# Patient Record
Sex: Female | Born: 1960 | Race: White | Hispanic: No | Marital: Married | Smoking: Current every day smoker
Health system: Southern US, Community
[De-identification: ages and names within clinical notes are randomized; demographics above are authoritative.]

## PROBLEM LIST (undated history)

## (undated) ENCOUNTER — Emergency Department (HOSPITAL_COMMUNITY): Admission: EM | Payer: Managed Care, Other (non HMO) | Source: Home / Self Care

## (undated) DIAGNOSIS — N289 Disorder of kidney and ureter, unspecified: Secondary | ICD-10-CM

## (undated) DIAGNOSIS — T7840XA Allergy, unspecified, initial encounter: Secondary | ICD-10-CM

## (undated) DIAGNOSIS — R569 Unspecified convulsions: Secondary | ICD-10-CM

## (undated) DIAGNOSIS — G709 Myoneural disorder, unspecified: Secondary | ICD-10-CM

## (undated) DIAGNOSIS — I10 Essential (primary) hypertension: Secondary | ICD-10-CM

## (undated) DIAGNOSIS — E079 Disorder of thyroid, unspecified: Secondary | ICD-10-CM

## (undated) DIAGNOSIS — K529 Noninfective gastroenteritis and colitis, unspecified: Secondary | ICD-10-CM

## (undated) DIAGNOSIS — K209 Esophagitis, unspecified without bleeding: Secondary | ICD-10-CM

## (undated) DIAGNOSIS — Z86010 Personal history of colonic polyps: Secondary | ICD-10-CM

## (undated) DIAGNOSIS — K8020 Calculus of gallbladder without cholecystitis without obstruction: Secondary | ICD-10-CM

## (undated) DIAGNOSIS — R002 Palpitations: Secondary | ICD-10-CM

## (undated) DIAGNOSIS — M1990 Unspecified osteoarthritis, unspecified site: Secondary | ICD-10-CM

## (undated) DIAGNOSIS — Z860101 Personal history of adenomatous and serrated colon polyps: Secondary | ICD-10-CM

## (undated) DIAGNOSIS — K469 Unspecified abdominal hernia without obstruction or gangrene: Secondary | ICD-10-CM

## (undated) DIAGNOSIS — F419 Anxiety disorder, unspecified: Secondary | ICD-10-CM

## (undated) DIAGNOSIS — M797 Fibromyalgia: Secondary | ICD-10-CM

## (undated) DIAGNOSIS — J40 Bronchitis, not specified as acute or chronic: Secondary | ICD-10-CM

## (undated) DIAGNOSIS — K317 Polyp of stomach and duodenum: Secondary | ICD-10-CM

## (undated) DIAGNOSIS — R0789 Other chest pain: Secondary | ICD-10-CM

## (undated) DIAGNOSIS — K219 Gastro-esophageal reflux disease without esophagitis: Secondary | ICD-10-CM

## (undated) HISTORY — DX: Noninfective gastroenteritis and colitis, unspecified: K52.9

## (undated) HISTORY — PX: CHOLECYSTECTOMY: SHX55

## (undated) HISTORY — DX: Other chest pain: R07.89

## (undated) HISTORY — DX: Essential (primary) hypertension: I10

## (undated) HISTORY — DX: Unspecified abdominal hernia without obstruction or gangrene: K46.9

## (undated) HISTORY — DX: Myoneural disorder, unspecified: G70.9

## (undated) HISTORY — PX: POLYPECTOMY: SHX149

## (undated) HISTORY — DX: Gastro-esophageal reflux disease without esophagitis: K21.9

## (undated) HISTORY — DX: Disorder of thyroid, unspecified: E07.9

## (undated) HISTORY — PX: TUBAL LIGATION: SHX77

## (undated) HISTORY — DX: Bronchitis, not specified as acute or chronic: J40

## (undated) HISTORY — DX: Personal history of adenomatous and serrated colon polyps: Z86.0101

## (undated) HISTORY — DX: Esophagitis, unspecified without bleeding: K20.90

## (undated) HISTORY — PX: UPPER GASTROINTESTINAL ENDOSCOPY: SHX188

## (undated) HISTORY — DX: Calculus of gallbladder without cholecystitis without obstruction: K80.20

## (undated) HISTORY — DX: Esophagitis, unspecified: K20.9

## (undated) HISTORY — PX: COLONOSCOPY: SHX174

## (undated) HISTORY — DX: Personal history of colonic polyps: Z86.010

## (undated) HISTORY — DX: Palpitations: R00.2

## (undated) HISTORY — DX: Anxiety disorder, unspecified: F41.9

## (undated) HISTORY — DX: Allergy, unspecified, initial encounter: T78.40XA

## (undated) HISTORY — DX: Polyp of stomach and duodenum: K31.7

---

## 2007-04-08 ENCOUNTER — Encounter: Admission: RE | Admit: 2007-04-08 | Discharge: 2007-04-08 | Payer: Self-pay | Admitting: Surgery

## 2010-08-24 ENCOUNTER — Emergency Department (HOSPITAL_COMMUNITY)
Admission: EM | Admit: 2010-08-24 | Discharge: 2010-08-24 | Disposition: A | Discharge status: Home / Self Care | Payer: Worker's Compensation | Attending: Emergency Medicine | Admitting: Emergency Medicine

## 2010-08-24 ENCOUNTER — Emergency Department (HOSPITAL_COMMUNITY): Payer: Worker's Compensation

## 2010-08-24 DIAGNOSIS — Y990 Civilian activity done for income or pay: Secondary | ICD-10-CM | POA: Insufficient documentation

## 2010-08-24 DIAGNOSIS — R209 Unspecified disturbances of skin sensation: Secondary | ICD-10-CM | POA: Insufficient documentation

## 2010-08-24 DIAGNOSIS — X500XXA Overexertion from strenuous movement or load, initial encounter: Secondary | ICD-10-CM | POA: Insufficient documentation

## 2010-08-24 DIAGNOSIS — M545 Low back pain, unspecified: Secondary | ICD-10-CM | POA: Insufficient documentation

## 2010-08-24 DIAGNOSIS — M48061 Spinal stenosis, lumbar region without neurogenic claudication: Secondary | ICD-10-CM | POA: Insufficient documentation

## 2011-05-26 ENCOUNTER — Other Ambulatory Visit: Payer: Self-pay

## 2011-05-26 ENCOUNTER — Emergency Department (HOSPITAL_COMMUNITY): Payer: Commercial Indemnity

## 2011-05-26 ENCOUNTER — Encounter: Payer: Self-pay | Admitting: *Deleted

## 2011-05-26 ENCOUNTER — Emergency Department (HOSPITAL_COMMUNITY)
Admission: EM | Admit: 2011-05-26 | Discharge: 2011-05-26 | Disposition: A | Discharge status: Home / Self Care | Payer: Commercial Indemnity | Attending: Emergency Medicine | Admitting: Emergency Medicine

## 2011-05-26 DIAGNOSIS — R079 Chest pain, unspecified: Secondary | ICD-10-CM | POA: Insufficient documentation

## 2011-05-26 DIAGNOSIS — R112 Nausea with vomiting, unspecified: Secondary | ICD-10-CM | POA: Insufficient documentation

## 2011-05-26 DIAGNOSIS — R0602 Shortness of breath: Secondary | ICD-10-CM | POA: Insufficient documentation

## 2011-05-26 DIAGNOSIS — R1013 Epigastric pain: Secondary | ICD-10-CM | POA: Insufficient documentation

## 2011-05-26 DIAGNOSIS — M129 Arthropathy, unspecified: Secondary | ICD-10-CM | POA: Insufficient documentation

## 2011-05-26 DIAGNOSIS — F411 Generalized anxiety disorder: Secondary | ICD-10-CM | POA: Insufficient documentation

## 2011-05-26 DIAGNOSIS — Z79899 Other long term (current) drug therapy: Secondary | ICD-10-CM | POA: Insufficient documentation

## 2011-05-26 HISTORY — DX: Fibromyalgia: M79.7

## 2011-05-26 HISTORY — DX: Unspecified osteoarthritis, unspecified site: M19.90

## 2011-05-26 LAB — HEPATIC FUNCTION PANEL
AST: 12 U/L (ref 0–37)
Albumin: 3.7 g/dL (ref 3.5–5.2)
Alkaline Phosphatase: 62 U/L (ref 39–117)
Total Bilirubin: 0.2 mg/dL — ABNORMAL LOW (ref 0.3–1.2)

## 2011-05-26 LAB — BASIC METABOLIC PANEL
CO2: 26 mEq/L (ref 19–32)
Glucose, Bld: 128 mg/dL — ABNORMAL HIGH (ref 70–99)
Potassium: 3.9 mEq/L (ref 3.5–5.1)
Sodium: 142 mEq/L (ref 135–145)

## 2011-05-26 LAB — CBC
Hemoglobin: 15.5 g/dL — ABNORMAL HIGH (ref 12.0–15.0)
MCH: 34.1 pg — ABNORMAL HIGH (ref 26.0–34.0)
RBC: 4.55 MIL/uL (ref 3.87–5.11)

## 2011-05-26 LAB — CARDIAC PANEL(CRET KIN+CKTOT+MB+TROPI): Relative Index: INVALID (ref 0.0–2.5)

## 2011-05-26 MED ORDER — NITROGLYCERIN 0.4 MG SL SUBL
0.4000 mg | SUBLINGUAL_TABLET | SUBLINGUAL | Status: DC | PRN
  Administered 2011-05-26 (×2): 0.4 mg via SUBLINGUAL
  Filled 2011-05-26: qty 25

## 2011-05-26 MED ORDER — IOHEXOL 300 MG/ML  SOLN
80.0000 mL | Freq: Once | INTRAMUSCULAR | Status: AC | PRN
  Administered 2011-05-26: 80 mL via INTRAVENOUS

## 2011-05-26 MED ORDER — PROMETHAZINE HCL 25 MG/ML IJ SOLN
25.0000 mg | Freq: Once | INTRAMUSCULAR | Status: AC
  Administered 2011-05-26: 25 mg via INTRAVENOUS
  Filled 2011-05-26 (×2): qty 1

## 2011-05-26 MED ORDER — METOCLOPRAMIDE HCL 5 MG/ML IJ SOLN
10.0000 mg | Freq: Once | INTRAMUSCULAR | Status: AC
  Administered 2011-05-26: 10 mg via INTRAVENOUS
  Filled 2011-05-26 (×2): qty 2

## 2011-05-26 MED ORDER — HYDROMORPHONE HCL PF 1 MG/ML IJ SOLN
1.0000 mg | Freq: Once | INTRAMUSCULAR | Status: AC
  Administered 2011-05-26: 1 mg via INTRAVENOUS
  Filled 2011-05-26: qty 1

## 2011-05-26 MED ORDER — ONDANSETRON HCL 4 MG/2ML IJ SOLN
INTRAMUSCULAR | Status: AC
  Administered 2011-05-26: 4 mg via INTRAVENOUS
  Filled 2011-05-26: qty 2

## 2011-05-26 MED ORDER — ONDANSETRON HCL 4 MG PO TABS: 8.0000 mg | ORAL_TABLET | Freq: Three times a day (TID) | ORAL | Status: AC | PRN

## 2011-05-26 MED ORDER — ASPIRIN 325 MG PO TABS
325.0000 mg | ORAL_TABLET | ORAL | Status: AC
  Administered 2011-05-26: 325 mg via ORAL

## 2011-05-26 NOTE — ED Notes (Signed)
Family at bedside. 

## 2011-05-26 NOTE — ED Notes (Signed)
Dr Read Drivers notified of hypotension, no relief of CP and pt's nausea.  Will see pt.

## 2011-05-26 NOTE — ED Notes (Signed)
Patient reports she has chest pain, n/v, and shortness of breath 2 hours ago.  Her family reports patient has had n/v all night.

## 2011-05-26 NOTE — ED Provider Notes (Signed)
Care from Dr Read Drivers. Awaiting LFTs, lipase and troponin. Still nauseated at this time. I will personally evaluate the pt, review her care thus far in the ER and continue her care at this time  10:21 AM Is better at this time.  Her upper studies and CT scans of the normal.  She'll be discharged home with antibiotics and all of her primary care Dr. tomorrow.  She will be referred to gastroenterology for additional followup if she continues to have symptoms.  He's also been instructed to return to the ER in 24-48 hours if her symptoms have not improved and she is continuing to have nausea and vomiting  Dg Chest 2 View  05/26/2011  *RADIOLOGY REPORT*  Clinical Data: Chest pain, shortness of breath.  CHEST - 2 VIEW  Comparison: None.  Findings: Lungs are clear. No pleural effusion or pneumothorax. The cardiomediastinal contours are within normal limits. The visualized bones and soft tissues are without significant appreciable abnormality.  IMPRESSION: No acute cardiopulmonary process.  Original Report Authenticated By: Waneta Martins, M.D.   Ct Chest W Contrast  05/26/2011  *RADIOLOGY REPORT*  Clinical Data: Chest pain  CT CHEST WITH CONTRAST  Technique:  Multidetector CT imaging of the chest was performed following the standard protocol during bolus administration of intravenous contrast.  Contrast: 80mL OMNIPAQUE IOHEXOL 300 MG/ML IV SOLN  Comparison: 05/26/2011 radiograph  Findings: Main and lobe are arterial branches are patent.  The segmental and subsegmental branches are not optimally evaluated due to contrast bolus timing.  Aorta is of normal course and caliber. Heart size is within normal limits.  No pericardial or pleural effusion.  No intrathoracic lymphadenopathy.  Limited images through the upper abdomen show no acute abnormality.  Central airways are patent. Very mild centrolobular emphysematous changes.  Otherwise, lungs are clear.  No pneumothorax.  No acute osseous abnormality.  IMPRESSION:  No acute intrathoracic process.  Original Report Authenticated By: Waneta Martins, M.D.   Results for orders placed during the hospital encounter of 05/26/11  CBC      Component Value Range   WBC 12.4 (*) 4.0 - 10.5 (K/uL)   RBC 4.55  3.87 - 5.11 (MIL/uL)   Hemoglobin 15.5 (*) 12.0 - 15.0 (g/dL)   HCT 52.8  41.3 - 24.4 (%)   MCV 97.4  78.0 - 100.0 (fL)   MCH 34.1 (*) 26.0 - 34.0 (pg)   MCHC 35.0  30.0 - 36.0 (g/dL)   RDW 01.0  27.2 - 53.6 (%)   Platelets 181  150 - 400 (K/uL)  BASIC METABOLIC PANEL      Component Value Range   Sodium 142  135 - 145 (mEq/L)   Potassium 3.9  3.5 - 5.1 (mEq/L)   Chloride 105  96 - 112 (mEq/L)   CO2 26  19 - 32 (mEq/L)   Glucose, Bld 128 (*) 70 - 99 (mg/dL)   BUN 19  6 - 23 (mg/dL)   Creatinine, Ser 6.44  0.50 - 1.10 (mg/dL)   Calcium 9.9  8.4 - 03.4 (mg/dL)   GFR calc non Af Amer 81 (*) >90 (mL/min)   GFR calc Af Amer >90  >90 (mL/min)  HEPATIC FUNCTION PANEL      Component Value Range   Total Protein 7.1  6.0 - 8.3 (g/dL)   Albumin 3.7  3.5 - 5.2 (g/dL)   AST 12  0 - 37 (U/L)   ALT 8  0 - 35 (U/L)   Alkaline Phosphatase 62  39 - 117 (U/L)   Total Bilirubin 0.2 (*) 0.3 - 1.2 (mg/dL)   Bilirubin, Direct <3.0  0.0 - 0.3 (mg/dL)   Indirect Bilirubin NOT CALCULATED  0.3 - 0.9 (mg/dL)  LIPASE, BLOOD      Component Value Range   Lipase 33  11 - 59 (U/L)  CARDIAC PANEL(CRET KIN+CKTOT+MB+TROPI)      Component Value Range   Total CK 51  7 - 177 (U/L)   CK, MB 1.5  0.3 - 4.0 (ng/mL)   Troponin I <0.30  <0.30 (ng/mL)   Relative Index RELATIVE INDEX IS INVALID  0.0 - 2.5    Diagnoses of Nausea and vomiting and Chest pain were pertinent to this visit. 1. Nausea and vomiting   2. Chest pain      Lyanne Co, MD 05/26/11 [phone]

## 2011-05-26 NOTE — ED Notes (Signed)
Continues to c/o 10/10 mid sternal CP and nausea and vomiting.  Cardiac monitor SB 55  141/86

## 2011-05-26 NOTE — ED Provider Notes (Signed)
History     CSN: [id number] Arrival date & time: 05/26/2011  2:56 AM   First MD Initiated Contact with Patient 05/26/11 0446      Chief Complaint  Patient presents with  . Chest Pain  . Nausea  . Shortness of Breath    (Consider location/radiation/quality/duration/timing/severity/associated sxs/prior treatment) HPI This is a 50 year old white female who had onset of nausea and vomiting about 11 PM yesterday. He nausea and vomiting was followed by severe chest pain which is substernal, is poorly characterized, is severe, is constant and is not anything previously experienced. She states the pain is making her breathe hard but she doubts frank dyspnea. She denies abdominal pain. She denies diarrhea. Nothing seems to make the pain better or worse.  Past Medical History  Diagnosis Date  . Arthritis   . Fibromyalgia     Past Surgical History  Procedure Date  . Cholecystectomy     History reviewed. No pertinent family history.  History  Substance Use Topics  . Smoking status: Former Smoker    Quit date: 05/17/2011  . Smokeless tobacco: Not on file  . Alcohol Use: No    OB History    Grav Para Term Preterm Abortions TAB SAB Ect Mult Living                  Review of Systems  All other systems reviewed and are negative.    Allergies  Review of patient's allergies indicates no known allergies.  Home Medications   Current Outpatient Rx  Name Route Sig Dispense Refill  . LORAZEPAM 0.5 MG PO TABS Oral Take 0.5 mg by mouth at bedtime.      . MELOXICAM 15 MG PO TABS Oral Take 7.5 mg by mouth 2 (two) times daily.     . OXYCODONE HCL 15 MG PO TABS Oral Take 15 mg by mouth every 4 (four) hours as needed. For pain      . TRAZODONE HCL 50 MG PO TABS Oral Take 50 mg by mouth at bedtime.        BP 137/85  Pulse 61  Temp(Src) 98 F (36.7 C) (Oral)  Resp 13  SpO2 99%  Physical Exam General: Well-developed, well-nourished female in no acute distress; appearance  consistent with age of record; uncomfortable appearing HENT: normocephalic, atraumatic Eyes: Normal appearance Neck: supple Heart: regular rate and rhythm; no murmurs Lungs: clear to auscultation bilaterally Abdomen: soft; mild epigastric tenderness; nondistended; no masses or hepatosplenomegaly; bowel sounds present Extremities: No deformity; full range of motion Neurologic: Awake, alert; motor function intact in all extremities and symmetric; no facial droop Skin: Warm and dry Psychiatric: Anxious    ED Course  Procedures (including critical care time)     MDM   Nursing notes and vitals signs, including pulse oximetry, reviewed.  Summary of this visit's results, reviewed by myself:  Labs:  Results for orders placed during the hospital encounter of 05/26/11  CBC      Component Value Range   WBC 12.4 (*) 4.0 - 10.5 (K/uL)   RBC 4.55  3.87 - 5.11 (MIL/uL)   Hemoglobin 15.5 (*) 12.0 - 15.0 (g/dL)   HCT 91.4  78.2 - 95.6 (%)   MCV 97.4  78.0 - 100.0 (fL)   MCH 34.1 (*) 26.0 - 34.0 (pg)   MCHC 35.0  30.0 - 36.0 (g/dL)   RDW 21.3  08.6 - 57.8 (%)   Platelets 181  150 - 400 (K/uL)  BASIC METABOLIC PANEL  Component Value Range   Sodium 142  135 - 145 (mEq/L)   Potassium 3.9  3.5 - 5.1 (mEq/L)   Chloride 105  96 - 112 (mEq/L)   CO2 26  19 - 32 (mEq/L)   Glucose, Bld 128 (*) 70 - 99 (mg/dL)   BUN 19  6 - 23 (mg/dL)   Creatinine, Ser 0.45  0.50 - 1.10 (mg/dL)   Calcium 9.9  8.4 - 40.9 (mg/dL)   GFR calc non Af Amer 81 (*) >90 (mL/min)   GFR calc Af Amer >90  >90 (mL/min)    Imaging Studies: Dg Chest 2 View  05/26/2011  *RADIOLOGY REPORT*  Clinical Data: Chest pain, shortness of breath.  CHEST - 2 VIEW  Comparison: None.  Findings: Lungs are clear. No pleural effusion or pneumothorax. The cardiomediastinal contours are within normal limits. The visualized bones and soft tissues are without significant appreciable abnormality.  IMPRESSION: No acute cardiopulmonary  process.  Original Report Authenticated By: Waneta Martins, M.D.   Ct Chest W Contrast  05/26/2011  *RADIOLOGY REPORT*  Clinical Data: Chest pain  CT CHEST WITH CONTRAST  Technique:  Multidetector CT imaging of the chest was performed following the standard protocol during bolus administration of intravenous contrast.  Contrast: 80mL OMNIPAQUE IOHEXOL 300 MG/ML IV SOLN  Comparison: 05/26/2011 radiograph  Findings: Main and lobe are arterial branches are patent.  The segmental and subsegmental branches are not optimally evaluated due to contrast bolus timing.  Aorta is of normal course and caliber. Heart size is within normal limits.  No pericardial or pleural effusion.  No intrathoracic lymphadenopathy.  Limited images through the upper abdomen show no acute abnormality.  Central airways are patent. Very mild centrolobular emphysematous changes.  Otherwise, lungs are clear.  No pneumothorax.  No acute osseous abnormality.  IMPRESSION: No acute intrathoracic process.  Original Report Authenticated By: Waneta Martins, M.D.   EKG Interpretation:  Date & Time: 05/26/2011 3:00 AM  Rate: 64  Rhythm: normal sinus rhythm  QRS Axis: normal  Intervals: normal  ST/T Wave abnormalities: normal  Conduction Disutrbances:none  Narrative Interpretation:   Old EKG Reviewed: none available              Hanley Seamen, MD 05/27/11 [phone]

## 2011-10-07 ENCOUNTER — Other Ambulatory Visit (HOSPITAL_COMMUNITY): Payer: Self-pay | Admitting: Family Medicine

## 2011-10-07 DIAGNOSIS — Z139 Encounter for screening, unspecified: Secondary | ICD-10-CM

## 2011-10-07 DIAGNOSIS — Z01419 Encounter for gynecological examination (general) (routine) without abnormal findings: Secondary | ICD-10-CM

## 2011-10-12 ENCOUNTER — Ambulatory Visit (HOSPITAL_COMMUNITY): Payer: Commercial Indemnity

## 2011-10-19 ENCOUNTER — Ambulatory Visit (HOSPITAL_COMMUNITY): Admission: RE | Admit: 2011-10-19 | Payer: Commercial Indemnity | Source: Ambulatory Visit

## 2011-12-29 ENCOUNTER — Encounter: Payer: Self-pay | Admitting: Gastroenterology

## 2012-07-27 ENCOUNTER — Encounter: Payer: Self-pay | Admitting: Physical Medicine & Rehabilitation

## 2012-08-19 ENCOUNTER — Encounter: Payer: Commercial Indemnity | Attending: Physical Medicine & Rehabilitation

## 2012-08-19 ENCOUNTER — Ambulatory Visit: Payer: Commercial Indemnity | Admitting: Physical Medicine & Rehabilitation

## 2012-10-25 ENCOUNTER — Encounter: Payer: Self-pay | Admitting: Gastroenterology

## 2012-11-25 ENCOUNTER — Ambulatory Visit (AMBULATORY_SURGERY_CENTER): Payer: Commercial Indemnity | Admitting: *Deleted

## 2012-11-25 VITALS — Ht 64.0 in | Wt 141.0 lb

## 2012-11-25 DIAGNOSIS — Z1211 Encounter for screening for malignant neoplasm of colon: Secondary | ICD-10-CM

## 2012-11-25 MED ORDER — SUPREP BOWEL PREP KIT 17.5-3.13-1.6 GM/177ML PO SOLN: ORAL | Status: DC

## 2012-11-28 ENCOUNTER — Encounter: Payer: Self-pay | Admitting: Gastroenterology

## 2012-12-09 ENCOUNTER — Ambulatory Visit (AMBULATORY_SURGERY_CENTER): Payer: Commercial Indemnity | Admitting: Gastroenterology

## 2012-12-09 ENCOUNTER — Encounter: Payer: Self-pay | Admitting: Gastroenterology

## 2012-12-09 VITALS — BP 117/78 | HR 64 | Temp 97.6°F | Resp 44 | Ht 64.0 in | Wt 141.0 lb

## 2012-12-09 DIAGNOSIS — D126 Benign neoplasm of colon, unspecified: Secondary | ICD-10-CM

## 2012-12-09 DIAGNOSIS — Z1211 Encounter for screening for malignant neoplasm of colon: Secondary | ICD-10-CM

## 2012-12-09 MED ORDER — SODIUM CHLORIDE 0.9 % IV SOLN: 500.0000 mL | INTRAVENOUS | Status: DC

## 2012-12-09 NOTE — Progress Notes (Signed)
Patient did not have preoperative order for IV antibiotic SSI prophylaxis. (G8918)  Patient did not experience any of the following events: a burn prior to discharge; a fall within the facility; wrong site/side/patient/procedure/implant event; or a hospital transfer or hospital admission upon discharge from the facility. (G8907)  

## 2012-12-09 NOTE — Patient Instructions (Addendum)
YOU HAD AN ENDOSCOPIC PROCEDURE TODAY AT THE [facility]: Refer to the procedure report that was given to you for any specific questions about what was found during the examination.  If the procedure report does not answer your questions, please call your gastroenterologist to clarify.  If you requested that your care partner not be given the details of your procedure findings, then the procedure report has been included in a sealed envelope for you to review at your convenience later.  YOU SHOULD EXPECT: Some feelings of bloating in the abdomen. Passage of more gas than usual.  Walking can help get rid of the air that was put into your GI tract during the procedure and reduce the bloating. If you had a lower endoscopy (such as a colonoscopy or flexible sigmoidoscopy) you may notice spotting of blood in your stool or on the toilet paper. If you underwent a bowel prep for your procedure, then you may not have a normal bowel movement for a few days.  DIET: Your first meal following the procedure should be a light meal and then it is ok to progress to your normal diet.  A half-sandwich or bowl of soup is an example of a good first meal.  Heavy or fried foods are harder to digest and may make you feel nauseous or bloated.  Likewise meals heavy in dairy and vegetables can cause extra gas to form and this can also increase the bloating.  Drink plenty of fluids but you should avoid alcoholic beverages for 24 hours.  ACTIVITY: Your care partner should take you home directly after the procedure.  You should plan to take it easy, moving slowly for the rest of the day.  You can resume normal activity the day after the procedure however you should NOT DRIVE or use heavy machinery for 24 hours (because of the sedation medicines used during the test).    SYMPTOMS TO REPORT IMMEDIATELY: A gastroenterologist can be reached at any hour.  During normal business hours, 8:30 AM to 5:00 PM Monday through Friday,  call [phone].  After hours and on weekends, please call the GI answering service at [phone] who will take a message and have the physician on call contact you.   Following lower endoscopy (colonoscopy or flexible sigmoidoscopy):  Excessive amounts of blood in the stool  Significant tenderness or worsening of abdominal pains  Swelling of the abdomen that is new, acute  Fever of 100F or higher    FOLLOW UP: If any biopsies were taken you will be contacted by phone or by letter within the next 1-3 weeks.  Call your gastroenterologist if you have not heard about the biopsies in 3 weeks.  Our staff will call the home number listed on your records the next business day following your procedure to check on you and address any questions or concerns that you may have at that time regarding the information given to you following your procedure. This is a courtesy call and so if there is no answer at the home number and we have not heard from you through the emergency physician on call, we will assume that you have returned to your regular daily activities without incident.  SIGNATURES/CONFIDENTIALITY: You and/or your care partner have signed paperwork which will be entered into your electronic medical record.  These signatures attest to the fact that that the information above on your After Visit Summary has been reviewed and is understood.  Full responsibility of the confidentiality   of this discharge information lies with you and/or your care-partner.     

## 2012-12-09 NOTE — Progress Notes (Signed)
Patient did not have preoperative order for IV antibiotic SSI prophylaxis. (G8918)   

## 2012-12-09 NOTE — Op Note (Signed)
[facility] [address] [place], [address]   COLONOSCOPY PROCEDURE REPORT  PATIENT: [name], Yolanda Minich  MR#: [id number] BIRTHDATE: 07-Nov-1960 , 51  yrs. old GENDER: Female ENDOSCOPIST: Louis Meckel, MD REFERRED GM:[id number] Laurence Aly. PROCEDURE DATE:  12/09/2012 PROCEDURE:   Colonoscopy with snare polypectomy ASA CLASS:   Class II INDICATIONS:average risk screening. MEDICATIONS: MAC sedation, administered by CRNA and propofol (Diprivan) 200mg  IV  DESCRIPTION OF PROCEDURE:   After the risks benefits and alternatives of the procedure were thoroughly explained, informed consent was obtained.  A digital rectal exam revealed no abnormalities of the rectum.   The LB [id number]  endoscope was introduced through the anus and advanced to the cecum, which was identified by both the appendix and ileocecal valve. No adverse events experienced.   The quality of the prep was excellent using Suprep  The instrument was then slowly withdrawn as the colon was fully examined.      COLON FINDINGS: A sessile polyp was found in the distal transverse colon.  A polypectomy was performed with a cold snare.  The resection was complete and the polyp tissue was completely retrieved.   The colon mucosa was otherwise normal.  Retroflexed views revealed no abnormalities. The time to cecum=4 minutes 30 seconds.  Withdrawal time=8 minutes 40 seconds.  The scope was withdrawn and the procedure completed. COMPLICATIONS: There were no complications.  ENDOSCOPIC IMPRESSION: 1.   Sessile polyp was found in the distal transverse colon; polypectomy was performed with a cold snare 2.   The colon mucosa was otherwise normal  RECOMMENDATIONS: If the polyp(s) removed today are proven to be adenomatous (pre-cancerous) polyps, you will need a repeat colonoscopy in 5 years.  Otherwise you should continue to follow colorectal cancer screening guidelines for "routine risk" patients with  colonoscopy in 10 years.  You will receive a letter within 1-2 weeks with the results of your biopsy as well as final recommendations.  Please call my office if you have not received a letter after 3 weeks.   eSigned:  Louis Meckel, MD 12/09/2012 11:02 AM   cc:

## 2012-12-09 NOTE — Progress Notes (Signed)
Called to room to assist during endoscopic procedure.  Patient ID and intended procedure confirmed with present staff. Received instructions for my participation in the procedure from the performing physician.  

## 2012-12-13 ENCOUNTER — Telehealth: Payer: Self-pay

## 2012-12-21 ENCOUNTER — Encounter: Payer: Self-pay | Admitting: Gastroenterology

## 2013-02-14 NOTE — Telephone Encounter (Signed)
Checking follow up info

## 2013-06-28 ENCOUNTER — Ambulatory Visit (HOSPITAL_COMMUNITY)
Admission: RE | Admit: 2013-06-28 | Discharge: 2013-06-28 | Disposition: A | Discharge status: Home / Self Care | Payer: Managed Care, Other (non HMO) | Source: Ambulatory Visit | Attending: Physician Assistant | Admitting: Physician Assistant

## 2013-06-28 ENCOUNTER — Other Ambulatory Visit (HOSPITAL_COMMUNITY): Payer: Self-pay | Admitting: Physician Assistant

## 2013-06-28 DIAGNOSIS — E039 Hypothyroidism, unspecified: Secondary | ICD-10-CM

## 2013-06-28 DIAGNOSIS — Z0000 Encounter for general adult medical examination without abnormal findings: Secondary | ICD-10-CM

## 2013-09-28 ENCOUNTER — Emergency Department (HOSPITAL_COMMUNITY)
Admission: EM | Admit: 2013-09-28 | Discharge: 2013-09-29 | Disposition: A | Discharge status: Home / Self Care | Payer: Managed Care, Other (non HMO) | Attending: Emergency Medicine | Admitting: Emergency Medicine

## 2013-09-28 ENCOUNTER — Encounter (HOSPITAL_COMMUNITY): Payer: Self-pay | Admitting: Emergency Medicine

## 2013-09-28 ENCOUNTER — Emergency Department (HOSPITAL_COMMUNITY): Payer: Managed Care, Other (non HMO)

## 2013-09-28 DIAGNOSIS — Z87442 Personal history of urinary calculi: Secondary | ICD-10-CM | POA: Insufficient documentation

## 2013-09-28 DIAGNOSIS — R142 Eructation: Secondary | ICD-10-CM | POA: Insufficient documentation

## 2013-09-28 DIAGNOSIS — R112 Nausea with vomiting, unspecified: Secondary | ICD-10-CM | POA: Insufficient documentation

## 2013-09-28 DIAGNOSIS — R109 Unspecified abdominal pain: Secondary | ICD-10-CM | POA: Insufficient documentation

## 2013-09-28 DIAGNOSIS — R141 Gas pain: Secondary | ICD-10-CM | POA: Insufficient documentation

## 2013-09-28 DIAGNOSIS — Z79899 Other long term (current) drug therapy: Secondary | ICD-10-CM | POA: Insufficient documentation

## 2013-09-28 DIAGNOSIS — R339 Retention of urine, unspecified: Secondary | ICD-10-CM | POA: Insufficient documentation

## 2013-09-28 DIAGNOSIS — G40909 Epilepsy, unspecified, not intractable, without status epilepticus: Secondary | ICD-10-CM | POA: Insufficient documentation

## 2013-09-28 DIAGNOSIS — Z8739 Personal history of other diseases of the musculoskeletal system and connective tissue: Secondary | ICD-10-CM | POA: Insufficient documentation

## 2013-09-28 DIAGNOSIS — E039 Hypothyroidism, unspecified: Secondary | ICD-10-CM | POA: Insufficient documentation

## 2013-09-28 DIAGNOSIS — R143 Flatulence: Secondary | ICD-10-CM

## 2013-09-28 DIAGNOSIS — Z87448 Personal history of other diseases of urinary system: Secondary | ICD-10-CM | POA: Insufficient documentation

## 2013-09-28 DIAGNOSIS — F411 Generalized anxiety disorder: Secondary | ICD-10-CM | POA: Insufficient documentation

## 2013-09-28 DIAGNOSIS — Z8719 Personal history of other diseases of the digestive system: Secondary | ICD-10-CM | POA: Insufficient documentation

## 2013-09-28 HISTORY — DX: Unspecified convulsions: R56.9

## 2013-09-28 HISTORY — DX: Disorder of kidney and ureter, unspecified: N28.9

## 2013-09-28 LAB — URINALYSIS, ROUTINE W REFLEX MICROSCOPIC
Bilirubin Urine: NEGATIVE
GLUCOSE, UA: NEGATIVE mg/dL
Hgb urine dipstick: NEGATIVE
KETONES UR: NEGATIVE mg/dL
LEUKOCYTES UA: NEGATIVE
Nitrite: NEGATIVE
PH: 7.5 (ref 5.0–8.0)
Protein, ur: NEGATIVE mg/dL
SPECIFIC GRAVITY, URINE: 1.014 (ref 1.005–1.030)
Urobilinogen, UA: 0.2 mg/dL (ref 0.0–1.0)

## 2013-09-28 LAB — COMPREHENSIVE METABOLIC PANEL
ALT: 19 U/L (ref 0–35)
AST: 20 U/L (ref 0–37)
Albumin: 3.8 g/dL (ref 3.5–5.2)
Alkaline Phosphatase: 85 U/L (ref 39–117)
BUN: 13 mg/dL (ref 6–23)
CHLORIDE: 106 meq/L (ref 96–112)
CO2: 22 mEq/L (ref 19–32)
CREATININE: 0.73 mg/dL (ref 0.50–1.10)
Calcium: 9.8 mg/dL (ref 8.4–10.5)
GFR calc Af Amer: >90 mL/min (ref >90)
Glucose, Bld: 88 mg/dL (ref 70–99)
Potassium: 3.9 mEq/L (ref 3.7–5.3)
Sodium: 143 mEq/L (ref 137–147)
Total Bilirubin: <0.2 mg/dL — ABNORMAL LOW (ref 0.3–1.2)
Total Protein: 7.1 g/dL (ref 6.0–8.3)

## 2013-09-28 LAB — CBC
HCT: 40.5 % (ref 36.0–46.0)
Hemoglobin: 13.8 g/dL (ref 12.0–15.0)
MCH: 32.6 pg (ref 26.0–34.0)
MCHC: 34.1 g/dL (ref 30.0–36.0)
MCV: 95.7 fL (ref 78.0–100.0)
Platelets: 247 10*3/uL (ref 150–400)
RBC: 4.23 MIL/uL (ref 3.87–5.11)
RDW: 13.9 % (ref 11.5–15.5)
WBC: 7.4 10*3/uL (ref 4.0–10.5)

## 2013-09-28 MED ORDER — FENTANYL CITRATE 0.05 MG/ML IJ SOLN
50.0000 ug | Freq: Once | INTRAMUSCULAR | Status: AC
  Administered 2013-09-28: 50 ug via INTRAVENOUS
  Filled 2013-09-28: qty 2

## 2013-09-28 MED ORDER — ONDANSETRON HCL 4 MG/2ML IJ SOLN
4.0000 mg | Freq: Once | INTRAMUSCULAR | Status: AC
  Administered 2013-09-28: 4 mg via INTRAVENOUS
  Filled 2013-09-28: qty 2

## 2013-09-28 NOTE — ED Notes (Signed)
Pt reports bilateral flank pain with urinary retention since 2000 last night. Pt reports drinking fluids, reports hx of kidney stones but she is usually able to pass them on her own. Pt a&o x4 at this time

## 2013-09-28 NOTE — ED Provider Notes (Signed)
CSN: [id number]     Arrival date & time 09/28/13  2025 History   First MD Initiated Contact with Patient 09/28/13 2118     Chief Complaint  Patient presents with  . Flank Pain  . Urinary Retention     (Consider location/radiation/quality/duration/timing/severity/associated sxs/prior Treatment) The history is provided by the patient and medical records.   This is a 53 year old female with past medical history significant for arthritis, fibromyalgia, anxiety, hypothyroidism, recurrent renal stones, presenting to the ED for left flank pain and urinary retention.  Patient states left flank pain started approximately 3 days ago, described as a sharp, stabbing sensation causing her to be curable and unable to find a comfortable position. She notes associated nausea but denies vomiting.  Patient has had no urine output since 2000 last night. States for the past several months she has been having difficulty urinating, states it takes her several minutes before she is able to initiate her urinary stream and feels that she is never able to fully empty her bladder. She has not been evaluated by urologist for this.  No recent fevers or chills.  Pt notes she did have a UTI 2 weeks ago, tx with abx by PCP.  No vaginal complaints.  Pt takes home oxycodone for pain regularly.  VS stable on arrival.  Past Medical History  Diagnosis Date  . Arthritis   . Fibromyalgia   . Allergy     shell fish  . Anxiety   . Thyroid disease     hypothryroid  . GERD (gastroesophageal reflux disease)   . Renal disorder     kidney stones  . Seizures     from Cymbalta   Past Surgical History  Procedure Laterality Date  . Cholecystectomy     Family History  Problem Relation Age of Onset  . Prostate cancer Father   . Heart disease Father   . Colon cancer Paternal Aunt   . Colon cancer Paternal Aunt    History  Substance Use Topics  . Smoking status: Former Smoker    Quit date: 05/17/2011  . Smokeless tobacco:  Never Used  . Alcohol Use: No   OB History   Grav Para Term Preterm Abortions TAB SAB Ect Mult Living                 Review of Systems  Gastrointestinal: Positive for nausea.  Genitourinary: Positive for flank pain and difficulty urinating.  All other systems reviewed and are negative.    Allergies  Iodine  Home Medications   Current Outpatient Rx  Name  Route  Sig  Dispense  Refill  . clonazePAM (KLONOPIN) 1 MG tablet   Oral   Take 1 mg by mouth 3 (three) times daily.         Marland Kitchen levothyroxine (SYNTHROID, LEVOTHROID) 25 MCG tablet   Oral   Take 25 mcg by mouth daily before breakfast.         . oxyCODONE (ROXICODONE) 15 MG immediate release tablet   Oral   Take 15 mg by mouth every 8 (eight) hours as needed for pain (Spine injuries). For pain           BP 126/78  Pulse 79  Temp(Src) 98.4 F (36.9 C) (Oral)  Resp 16  SpO2 99%  Physical Exam  Nursing note and vitals reviewed. Constitutional: She is oriented to person, place, and time. She appears well-developed and well-nourished. No distress.  HENT:  Head: Normocephalic and atraumatic.  Mouth/Throat: Oropharynx  is clear and moist.  Eyes: Conjunctivae and EOM are normal. Pupils are equal, round, and reactive to light.  Neck: Normal range of motion. Neck supple.  Cardiovascular: Normal rate, regular rhythm and normal heart sounds.   Pulmonary/Chest: Effort normal and breath sounds normal. No respiratory distress. She has no wheezes.  Abdominal: Soft. Bowel sounds are normal. She exhibits distension. There is tenderness in the suprapubic area. There is CVA tenderness.  Abdomen soft, mild distention and TTP over suprapubic region; left CVA tenderness  Musculoskeletal: Normal range of motion. She exhibits no edema.  Neurological: She is alert and oriented to person, place, and time.  Skin: Skin is warm and dry. She is not diaphoretic.  Psychiatric: She has a normal mood and affect.    ED Course  Procedures  (including critical care time) Labs Review Labs Reviewed  URINALYSIS, ROUTINE W REFLEX MICROSCOPIC - Abnormal; Notable for the following:    APPearance CLOUDY (*)    All other components within normal limits  COMPREHENSIVE METABOLIC PANEL - Abnormal; Notable for the following:    Total Bilirubin <0.2 (*)    All other components within normal limits  CBC   Imaging Review Ct Abdomen Pelvis Wo Contrast  09/28/2013   CLINICAL DATA:  Bilateral flank pain.  EXAM: CT ABDOMEN AND PELVIS WITHOUT CONTRAST  TECHNIQUE: Multidetector CT imaging of the abdomen and pelvis was performed following the standard protocol without intravenous contrast.  COMPARISON:  None.  FINDINGS: Visualized lung bases appear normal. No osseous abnormality is noted.  Status post cholecystectomy. No focal abnormality seen in the liver, spleen or pancreas on these unenhanced images. Adrenal glands appear normal. Nonobstructive calculus is seen in midpole collecting system of the right kidney. No hydronephrosis or renal obstruction is noted. No ureteral calculi are noted. Urinary bladder is decompressed secondary to Foley catheter. There is no evidence of bowel obstruction. No abnormal fluid collection is noted. Uterus appears normal. No significant adenopathy is noted.  IMPRESSION: Small nonobstructive right renal calculus. No hydronephrosis or renal obstruction is identified. No acute abnormality seen in the abdomen or pelvis.   Electronically Signed   By: Sabino Dick M.D.   On: 09/28/2013 23:35    EKG Interpretation None      MDM   Final diagnoses:  Left flank pain  Urinary retention   53 y.o. F with left flank pain with no urine output in the the past 26 hours.  Bladder scan done revealing ~600cc retained.  Pt has hx of recurrent renal stones, suspicion today for the same.  If still unable to urinate here, will place foley catheter.  Will obtain labs, u/a, and CT abd/pelvis without contrast.  Foley placed with return of  700+ cc of urine.  No noted hematuria or clots.  Labs as above, renal function preserved.  CT abd/pelvis with small, nonobstructing right renal calculus. No hydronephrosis.  Patient states she feels better after placement of Foley catheter, however now is becoming very uncomfortable and she requests it be removed.  I have advised her to FU with urology regarding this episode of urinary retention as it is following several weeks of incomplete bladder emptying and difficulty initiating urinary stream.  I have advised her if she is not able to urinate tomorrow, she may need to return to ED to have foley replaced until she can see urologist.  She acknowledged understanding and agreed with plan of care.    Larene Pickett, PA-C 09/29/13 [phone]

## 2013-09-29 NOTE — Discharge Instructions (Signed)
Follow up with [facility] regarding urinary retention-- call their office to schedule an appt. Return to the ED for new or worsening symptoms.

## 2013-10-02 NOTE — ED Provider Notes (Signed)
Medical screening examination/treatment/procedure(s) were performed by non-physician practitioner and as supervising physician I was immediately available for consultation/collaboration.   EKG Interpretation None        Blanchie Dessert, MD 10/02/13 1533

## 2013-10-06 ENCOUNTER — Encounter (HOSPITAL_COMMUNITY): Payer: Self-pay | Admitting: Emergency Medicine

## 2013-10-06 ENCOUNTER — Emergency Department (HOSPITAL_COMMUNITY)
Admission: EM | Admit: 2013-10-06 | Discharge: 2013-10-07 | Disposition: A | Discharge status: Home / Self Care | Payer: Managed Care, Other (non HMO) | Attending: Emergency Medicine | Admitting: Emergency Medicine

## 2013-10-06 DIAGNOSIS — Z87891 Personal history of nicotine dependence: Secondary | ICD-10-CM | POA: Insufficient documentation

## 2013-10-06 DIAGNOSIS — Z87448 Personal history of other diseases of urinary system: Secondary | ICD-10-CM | POA: Insufficient documentation

## 2013-10-06 DIAGNOSIS — M129 Arthropathy, unspecified: Secondary | ICD-10-CM | POA: Insufficient documentation

## 2013-10-06 DIAGNOSIS — F411 Generalized anxiety disorder: Secondary | ICD-10-CM | POA: Insufficient documentation

## 2013-10-06 DIAGNOSIS — G40909 Epilepsy, unspecified, not intractable, without status epilepticus: Secondary | ICD-10-CM | POA: Insufficient documentation

## 2013-10-06 DIAGNOSIS — R52 Pain, unspecified: Secondary | ICD-10-CM | POA: Insufficient documentation

## 2013-10-06 DIAGNOSIS — R112 Nausea with vomiting, unspecified: Secondary | ICD-10-CM

## 2013-10-06 DIAGNOSIS — Z87442 Personal history of urinary calculi: Secondary | ICD-10-CM | POA: Insufficient documentation

## 2013-10-06 DIAGNOSIS — Z8719 Personal history of other diseases of the digestive system: Secondary | ICD-10-CM | POA: Insufficient documentation

## 2013-10-06 DIAGNOSIS — E039 Hypothyroidism, unspecified: Secondary | ICD-10-CM | POA: Insufficient documentation

## 2013-10-06 DIAGNOSIS — IMO0001 Reserved for inherently not codable concepts without codable children: Secondary | ICD-10-CM | POA: Insufficient documentation

## 2013-10-06 DIAGNOSIS — M2550 Pain in unspecified joint: Secondary | ICD-10-CM | POA: Insufficient documentation

## 2013-10-06 DIAGNOSIS — Z79899 Other long term (current) drug therapy: Secondary | ICD-10-CM | POA: Insufficient documentation

## 2013-10-06 LAB — COMPREHENSIVE METABOLIC PANEL
ALBUMIN: 4.9 g/dL (ref 3.5–5.2)
ALT: 35 U/L (ref 0–35)
AST: 21 U/L (ref 0–37)
Alkaline Phosphatase: 97 U/L (ref 39–117)
BUN: 16 mg/dL (ref 6–23)
CO2: 24 mEq/L (ref 19–32)
CREATININE: 0.79 mg/dL (ref 0.50–1.10)
Calcium: 11.3 mg/dL — ABNORMAL HIGH (ref 8.4–10.5)
Chloride: 100 mEq/L (ref 96–112)
GFR calc Af Amer: >90 mL/min (ref >90)
GFR calc non Af Amer: >90 mL/min (ref >90)
Glucose, Bld: 118 mg/dL — ABNORMAL HIGH (ref 70–99)
Potassium: 4.2 mEq/L (ref 3.7–5.3)
Sodium: 144 mEq/L (ref 137–147)
Total Bilirubin: 0.4 mg/dL (ref 0.3–1.2)
Total Protein: 9 g/dL — ABNORMAL HIGH (ref 6.0–8.3)

## 2013-10-06 LAB — CBC WITH DIFFERENTIAL/PLATELET
BASOS ABS: 0 10*3/uL (ref 0.0–0.1)
BASOS PCT: 0 % (ref 0–1)
EOS ABS: 0 10*3/uL (ref 0.0–0.7)
Eosinophils Relative: 0 % (ref 0–5)
HCT: 48.9 % — ABNORMAL HIGH (ref 36.0–46.0)
HEMOGLOBIN: 17.1 g/dL — AB (ref 12.0–15.0)
Lymphocytes Relative: 20 % (ref 12–46)
Lymphs Abs: 2.7 10*3/uL (ref 0.7–4.0)
MCH: 33 pg (ref 26.0–34.0)
MCHC: 35 g/dL (ref 30.0–36.0)
MCV: 94.4 fL (ref 78.0–100.0)
MONO ABS: 0.8 10*3/uL (ref 0.1–1.0)
MONOS PCT: 6 % (ref 3–12)
Neutro Abs: 9.9 10*3/uL — ABNORMAL HIGH (ref 1.7–7.7)
Neutrophils Relative %: 74 % (ref 43–77)
Platelets: 315 10*3/uL (ref 150–400)
RBC: 5.18 MIL/uL — ABNORMAL HIGH (ref 3.87–5.11)
RDW: 13.5 % (ref 11.5–15.5)
WBC: 13.4 10*3/uL — ABNORMAL HIGH (ref 4.0–10.5)

## 2013-10-06 LAB — URINE MICROSCOPIC-ADD ON

## 2013-10-06 LAB — URINALYSIS, ROUTINE W REFLEX MICROSCOPIC
BILIRUBIN URINE: NEGATIVE
Glucose, UA: NEGATIVE mg/dL
Hgb urine dipstick: NEGATIVE
Ketones, ur: NEGATIVE mg/dL
Nitrite: NEGATIVE
PROTEIN: 100 mg/dL — AB
Specific Gravity, Urine: 1.028 (ref 1.005–1.030)
UROBILINOGEN UA: 0.2 mg/dL (ref 0.0–1.0)
pH: 6.5 (ref 5.0–8.0)

## 2013-10-06 LAB — LIPASE, BLOOD: Lipase: 42 U/L (ref 11–59)

## 2013-10-06 MED ORDER — HYDROMORPHONE HCL PF 1 MG/ML IJ SOLN
0.5000 mg | Freq: Once | INTRAMUSCULAR | Status: AC
  Administered 2013-10-06: 0.5 mg via INTRAVENOUS
  Filled 2013-10-06: qty 1

## 2013-10-06 MED ORDER — ONDANSETRON HCL 4 MG/2ML IJ SOLN
4.0000 mg | Freq: Once | INTRAMUSCULAR | Status: AC
  Administered 2013-10-06: 4 mg via INTRAVENOUS
  Filled 2013-10-06: qty 2

## 2013-10-06 MED ORDER — PROMETHAZINE HCL 25 MG/ML IJ SOLN
12.5000 mg | Freq: Once | INTRAMUSCULAR | Status: AC
  Administered 2013-10-06: 12.5 mg via INTRAVENOUS
  Filled 2013-10-06: qty 1

## 2013-10-06 MED ORDER — SODIUM CHLORIDE 0.9 % IV BOLUS (SEPSIS)
1000.0000 mL | Freq: Once | INTRAVENOUS | Status: AC
  Administered 2013-10-06: 1000 mL via INTRAVENOUS

## 2013-10-06 MED ORDER — LORAZEPAM 2 MG/ML IJ SOLN
0.5000 mg | Freq: Once | INTRAMUSCULAR | Status: AC
  Administered 2013-10-06: 0.5 mg via INTRAVENOUS
  Filled 2013-10-06: qty 1

## 2013-10-06 NOTE — ED Notes (Signed)
Pt cannot urinate.  Will try again in 30 minutes. 

## 2013-10-06 NOTE — ED Notes (Signed)
Pt states she began having stomach pains days go, emesis began last night, denies diarrhea. Pt was seen 3/12 for kidney stone which she passed since visit to ED.

## 2013-10-06 NOTE — ED Provider Notes (Signed)
CSN: [id number]     Arrival date & time 10/06/13  1916 History   First MD Initiated Contact with Patient 10/06/13 2054     Chief Complaint  Patient presents with  . Emesis     (Consider location/radiation/quality/duration/timing/severity/associated sxs/prior Treatment) HPI Yolanda [name] is a 53 y.o. female who presents to emergency department with complaint of nausea, vomiting, and generalized pain. Patient states that she has history of fibromyalgia, and began vomiting this morning. She states she has vomited more times that she can count. She states she's concerned because she's unable to keep her thyroid, her seizure, and her pain medications down. Patient denies any fever or chills. She denies any abdominal pain. She denies any chest pain, shortness of breath. States recently was seen here for kidney stone. States she passed it and feels better. Pt denies any urinary symptoms. No diarrhea. Passing gas. Prior cholecystectomy, otherwise no abdominal problems.   Past Medical History  Diagnosis Date  . Arthritis   . Fibromyalgia   . Allergy     shell fish  . Anxiety   . Thyroid disease     hypothryroid  . GERD (gastroesophageal reflux disease)   . Renal disorder     kidney stones  . Seizures     from Cymbalta   Past Surgical History  Procedure Laterality Date  . Cholecystectomy     Family History  Problem Relation Age of Onset  . Prostate cancer Father   . Heart disease Father   . Colon cancer Paternal Aunt   . Colon cancer Paternal Aunt    History  Substance Use Topics  . Smoking status: Former Smoker    Quit date: 05/17/2011  . Smokeless tobacco: Never Used  . Alcohol Use: No   OB History   Grav Para Term Preterm Abortions TAB SAB Ect Mult Living                 Review of Systems  Constitutional: Negative for fever and chills.  Respiratory: Negative for cough, chest tightness and shortness of breath.   Cardiovascular: Negative for chest pain, palpitations and  leg swelling.  Gastrointestinal: Positive for nausea and vomiting. Negative for abdominal pain and diarrhea.  Genitourinary: Negative for dysuria, flank pain and pelvic pain.  Musculoskeletal: Positive for arthralgias and myalgias. Negative for neck pain and neck stiffness.  Skin: Negative for rash.  Neurological: Positive for weakness. Negative for dizziness and headaches.  All other systems reviewed and are negative.      Allergies  Iodine  Home Medications   Current Outpatient Rx  Name  Route  Sig  Dispense  Refill  . clonazePAM (KLONOPIN) 1 MG tablet   Oral   Take 1 mg by mouth 3 (three) times daily.         Marland Kitchen levothyroxine (SYNTHROID, LEVOTHROID) 25 MCG tablet   Oral   Take 25 mcg by mouth daily before breakfast.         . ondansetron (ZOFRAN) 8 MG tablet   Oral   Take 8 mg by mouth every 8 (eight) hours as needed for nausea or vomiting.         Marland Kitchen oxyCODONE (ROXICODONE) 15 MG immediate release tablet   Oral   Take 15 mg by mouth every 8 (eight) hours as needed for pain (Spine injuries). For pain           BP 160/99  Pulse 74  Temp(Src) 98.8 F (37.1 C) (Oral)  Ht 5\' 4"  (  1.626 m)  Wt 120 lb (54.432 kg)  BMI 20.59 kg/m2  SpO2 99% Physical Exam  Nursing note and vitals reviewed. Constitutional: She is oriented to person, place, and time. She appears well-developed and well-nourished. No distress.  HENT:  Head: Normocephalic.  Eyes: Conjunctivae are normal.  Neck: Neck supple.  Cardiovascular: Normal rate, regular rhythm and normal heart sounds.   Pulmonary/Chest: Effort normal and breath sounds normal. No respiratory distress. She has no wheezes. She has no rales.  Abdominal: Soft. Bowel sounds are normal. She exhibits no distension. There is no tenderness. There is no rebound and no guarding.  Musculoskeletal: She exhibits no edema.  Neurological: She is alert and oriented to person, place, and time.  Skin: Skin is warm and dry.  Psychiatric: She  has a normal mood and affect. Her behavior is normal.    ED Course  Procedures (including critical care time) Labs Review Labs Reviewed  CBC WITH DIFFERENTIAL - Abnormal; Notable for the following:    WBC 13.4 (*)    RBC 5.18 (*)    Hemoglobin 17.1 (*)    HCT 48.9 (*)    Neutro Abs 9.9 (*)    All other components within normal limits  COMPREHENSIVE METABOLIC PANEL - Abnormal; Notable for the following:    Glucose, Bld 118 (*)    Calcium 11.3 (*)    Total Protein 9.0 (*)    All other components within normal limits  URINE CULTURE  LIPASE, BLOOD  URINALYSIS, ROUTINE W REFLEX MICROSCOPIC   Imaging Review No results found.   EKG Interpretation None      MDM   Final diagnoses:  None    Pt with nausea, vomiting. Abdomen soft, non tender. She is afebrile. No urinary symptoms. No diarrhea. Will get fluids, zofran, dilaudid for fibromyalgia pain, pt worried because unable to take her regular medications due to persistent vomiting.   1:05 AM Pt received zofran, phenergan, ativan, dilaudid, fluids. Labs show elevated WBC, but also hgb, suspect hemoconcentration. Her abdomen continues to be soft, no surgical abdomen. Do not think any imaging is needed at this time. UA sent for culture, no clear infection. Pt had a failed oral trial. Will add more pain medications, dilaudid, and reglan.   Signed out at shift change to PA Melwood. If fails another oral trial, admit.   Filed Vitals:   10/06/13 2028 10/06/13 2042 10/06/13 2308  BP: 160/99  149/94  Pulse: 74  76  Temp: 98.8 F (37.1 C)  98.2 F (36.8 C)  TempSrc: Oral  Oral  Resp:   18  Height:  5\' 4"  (1.626 m)   Weight:  120 lb (54.432 kg)   SpO2: 99%  98%       Isley Zinni A Aariana Shankland, PA-C 10/07/13 0107

## 2013-10-07 MED ORDER — ONDANSETRON HCL 8 MG PO TABS: 8.0000 mg | ORAL_TABLET | Freq: Three times a day (TID) | ORAL | Status: DC | PRN

## 2013-10-07 MED ORDER — HYDROMORPHONE HCL PF 1 MG/ML IJ SOLN
0.5000 mg | Freq: Once | INTRAMUSCULAR | Status: AC
  Administered 2013-10-07: 0.5 mg via INTRAVENOUS
  Filled 2013-10-07: qty 1

## 2013-10-07 MED ORDER — METOCLOPRAMIDE HCL 5 MG/ML IJ SOLN
10.0000 mg | Freq: Once | INTRAMUSCULAR | Status: AC
  Administered 2013-10-07: 10 mg via INTRAVENOUS
  Filled 2013-10-07: qty 2

## 2013-10-07 MED ORDER — PROMETHAZINE HCL 25 MG RE SUPP: 25.0000 mg | Freq: Four times a day (QID) | RECTAL | Status: DC | PRN

## 2013-10-07 NOTE — ED Notes (Signed)
Pt reports feeling much better after Reglan administration. Able to keep down ginger ale. Claiborne Billings, [place] made aware.

## 2013-10-07 NOTE — ED Notes (Signed)
Pt reports vomiting water from fluid challenge back up. Small amt of very clear liquid noted in emesis bag. Lahoma Rocker, PA at bedside.

## 2013-10-07 NOTE — Discharge Instructions (Signed)

## 2013-10-08 ENCOUNTER — Encounter (HOSPITAL_COMMUNITY): Payer: Self-pay | Admitting: Emergency Medicine

## 2013-10-08 ENCOUNTER — Emergency Department (HOSPITAL_COMMUNITY)
Admission: EM | Admit: 2013-10-08 | Discharge: 2013-10-09 | Disposition: A | Discharge status: Home / Self Care | Payer: Managed Care, Other (non HMO) | Attending: Emergency Medicine | Admitting: Emergency Medicine

## 2013-10-08 DIAGNOSIS — M129 Arthropathy, unspecified: Secondary | ICD-10-CM | POA: Insufficient documentation

## 2013-10-08 DIAGNOSIS — R1084 Generalized abdominal pain: Secondary | ICD-10-CM | POA: Insufficient documentation

## 2013-10-08 DIAGNOSIS — G40909 Epilepsy, unspecified, not intractable, without status epilepticus: Secondary | ICD-10-CM | POA: Insufficient documentation

## 2013-10-08 DIAGNOSIS — F411 Generalized anxiety disorder: Secondary | ICD-10-CM | POA: Insufficient documentation

## 2013-10-08 DIAGNOSIS — N390 Urinary tract infection, site not specified: Secondary | ICD-10-CM

## 2013-10-08 DIAGNOSIS — E039 Hypothyroidism, unspecified: Secondary | ICD-10-CM | POA: Insufficient documentation

## 2013-10-08 DIAGNOSIS — K529 Noninfective gastroenteritis and colitis, unspecified: Secondary | ICD-10-CM

## 2013-10-08 DIAGNOSIS — IMO0001 Reserved for inherently not codable concepts without codable children: Secondary | ICD-10-CM | POA: Insufficient documentation

## 2013-10-08 DIAGNOSIS — F17200 Nicotine dependence, unspecified, uncomplicated: Secondary | ICD-10-CM | POA: Insufficient documentation

## 2013-10-08 DIAGNOSIS — K5289 Other specified noninfective gastroenteritis and colitis: Secondary | ICD-10-CM | POA: Insufficient documentation

## 2013-10-08 DIAGNOSIS — Z79899 Other long term (current) drug therapy: Secondary | ICD-10-CM | POA: Insufficient documentation

## 2013-10-08 DIAGNOSIS — G8929 Other chronic pain: Secondary | ICD-10-CM | POA: Insufficient documentation

## 2013-10-08 LAB — CBC WITH DIFFERENTIAL/PLATELET
BASOS ABS: 0 10*3/uL (ref 0.0–0.1)
Basophils Relative: 0 % (ref 0–1)
Eosinophils Absolute: 0.1 10*3/uL (ref 0.0–0.7)
Eosinophils Relative: 0 % (ref 0–5)
HCT: 45.8 % (ref 36.0–46.0)
Hemoglobin: 15.7 g/dL — ABNORMAL HIGH (ref 12.0–15.0)
LYMPHS ABS: 3.8 10*3/uL (ref 0.7–4.0)
LYMPHS PCT: 32 % (ref 12–46)
MCH: 32.5 pg (ref 26.0–34.0)
MCHC: 34.3 g/dL (ref 30.0–36.0)
MCV: 94.8 fL (ref 78.0–100.0)
MONO ABS: 0.9 10*3/uL (ref 0.1–1.0)
Monocytes Relative: 8 % (ref 3–12)
Neutro Abs: 7 10*3/uL (ref 1.7–7.7)
Neutrophils Relative %: 60 % (ref 43–77)
Platelets: 266 10*3/uL (ref 150–400)
RBC: 4.83 MIL/uL (ref 3.87–5.11)
RDW: 13.3 % (ref 11.5–15.5)
WBC: 11.7 10*3/uL — AB (ref 4.0–10.5)

## 2013-10-08 LAB — URINE CULTURE

## 2013-10-08 LAB — LIPASE, BLOOD: Lipase: 66 U/L — ABNORMAL HIGH (ref 11–59)

## 2013-10-08 LAB — COMPREHENSIVE METABOLIC PANEL
ALT: 26 U/L (ref 0–35)
AST: 19 U/L (ref 0–37)
Albumin: 4.4 g/dL (ref 3.5–5.2)
Alkaline Phosphatase: 80 U/L (ref 39–117)
BUN: 13 mg/dL (ref 6–23)
CALCIUM: 10.4 mg/dL (ref 8.4–10.5)
CO2: 25 mEq/L (ref 19–32)
CREATININE: 0.82 mg/dL (ref 0.50–1.10)
Chloride: 100 mEq/L (ref 96–112)
GFR, EST NON AFRICAN AMERICAN: 80 mL/min — AB (ref >90)
GLUCOSE: 98 mg/dL (ref 70–99)
Potassium: 3.6 mEq/L — ABNORMAL LOW (ref 3.7–5.3)
Sodium: 141 mEq/L (ref 137–147)
Total Bilirubin: 0.6 mg/dL (ref 0.3–1.2)
Total Protein: 8.1 g/dL (ref 6.0–8.3)

## 2013-10-08 LAB — TROPONIN I

## 2013-10-08 MED ORDER — ONDANSETRON HCL 4 MG/2ML IJ SOLN
4.0000 mg | Freq: Once | INTRAMUSCULAR | Status: AC
  Administered 2013-10-08: 4 mg via INTRAVENOUS
  Filled 2013-10-08: qty 2

## 2013-10-08 MED ORDER — HYDROMORPHONE HCL PF 1 MG/ML IJ SOLN
1.0000 mg | Freq: Once | INTRAMUSCULAR | Status: AC
  Administered 2013-10-08: 1 mg via INTRAVENOUS
  Filled 2013-10-08: qty 1

## 2013-10-08 MED ORDER — METOCLOPRAMIDE HCL 5 MG/ML IJ SOLN
10.0000 mg | Freq: Once | INTRAMUSCULAR | Status: AC
  Administered 2013-10-08: 10 mg via INTRAVENOUS
  Filled 2013-10-08: qty 2

## 2013-10-08 NOTE — ED Notes (Signed)
Patient states she has chest pain that started at 0300, patient states this is the first times she has had this pain, documentation shows that she had same symptoms 2 days ago. Patient c/o nausea and vomiting, states she has been taking zofran and phenergan suppositories that she was given 2 days ago without relief.

## 2013-10-08 NOTE — ED Provider Notes (Signed)
CSN: [id number]     Arrival date & time 10/08/13  1907 History   First MD Initiated Contact with Patient 10/08/13 2246     Chief Complaint  Patient presents with  . Chest Pain  . Nausea  . Emesis     (Consider location/radiation/quality/duration/timing/severity/associated sxs/prior Treatment) HPI Comments: Patient 53 year old female with history of fibromyalgia, anxiety, hypothyroidism, GERD who presents today with persistent nausea and vomiting. She reports that since 3/20 she has been unable to keep her food or medications down. She has worsening of her chronic pain which is diffusely over her body due to fibromyalgia. She was given medications to take at home, but only the combination of dilaudid, ativan, phenergan, zofran, and reglan helped her last time. The pain is the worst in her back and it is a stabbing pain. She has not had diarrhea since yesterday which she attributes to not eating. She denies fevers, chills, shortness of breath, chest pain, dysuria, urinary urgency, urinary frequency.   The history is provided by the patient. No language interpreter was used.    Past Medical History  Diagnosis Date  . Arthritis   . Fibromyalgia   . Allergy     shell fish  . Anxiety   . Thyroid disease     hypothryroid  . GERD (gastroesophageal reflux disease)   . Renal disorder     kidney stones  . Seizures     from Cymbalta   Past Surgical History  Procedure Laterality Date  . Cholecystectomy     Family History  Problem Relation Age of Onset  . Prostate cancer Father   . Heart disease Father   . Colon cancer Paternal Aunt   . Colon cancer Paternal Aunt    History  Substance Use Topics  . Smoking status: Current Every Day Smoker -- 0.50 packs/day    Types: Cigarettes  . Smokeless tobacco: Never Used  . Alcohol Use: No   OB History   Grav Para Term Preterm Abortions TAB SAB Ect Mult Living                 Review of Systems  Constitutional: Negative for fever and  chills.  Respiratory: Negative for shortness of breath.   Cardiovascular: Negative for chest pain.  Gastrointestinal: Positive for nausea, vomiting and abdominal pain.  Genitourinary: Negative for dysuria, urgency and frequency.  Musculoskeletal: Positive for arthralgias, back pain and myalgias.  All other systems reviewed and are negative.      Allergies  Iodine and Shellfish allergy  Home Medications   Current Outpatient Rx  Name  Route  Sig  Dispense  Refill  . clonazePAM (KLONOPIN) 1 MG tablet   Oral   Take 1 mg by mouth 3 (three) times daily.         Marland Kitchen levothyroxine (SYNTHROID, LEVOTHROID) 25 MCG tablet   Oral   Take 25 mcg by mouth daily before breakfast.         . ondansetron (ZOFRAN) 8 MG tablet   Oral   Take 1 tablet (8 mg total) by mouth every 8 (eight) hours as needed for nausea or vomiting.   20 tablet   0   . oxyCODONE (ROXICODONE) 15 MG immediate release tablet   Oral   Take 15 mg by mouth every 8 (eight) hours as needed for pain (Spine injuries). For pain          . promethazine (PHENERGAN) 25 MG suppository   Rectal   Place 1 suppository (25  mg total) rectally every 6 (six) hours as needed for nausea or vomiting.   12 each   0    BP 144/97  Pulse 69  Temp(Src) 98.5 F (36.9 C) (Oral)  Resp 20  Ht 5\' 4"  (1.626 m)  Wt 120 lb (54.432 kg)  BMI 20.59 kg/m2  SpO2 96% Physical Exam  Nursing note and vitals reviewed. Constitutional: She is oriented to person, place, and time. She appears well-developed and well-nourished. No distress.  Patient is well appearing, in no distress  HENT:  Head: Normocephalic and atraumatic.  Right Ear: External ear normal.  Left Ear: External ear normal.  Nose: Nose normal.  Mouth/Throat: Oropharynx is clear and moist.  Eyes: Conjunctivae are normal.  Neck: Normal range of motion.  Cardiovascular: Normal rate, regular rhythm and normal heart sounds.   Pulmonary/Chest: Effort normal and breath sounds normal.  No stridor. No respiratory distress. She has no wheezes. She has no rales.  Abdominal: Soft. She exhibits no distension. There is generalized tenderness. There is no rigidity, no rebound and no guarding.  Mild tenderness to deep palpation.   Musculoskeletal: Normal range of motion.  Neurological: She is alert and oriented to person, place, and time. She has normal strength.  Skin: Skin is warm and dry. She is not diaphoretic. No erythema.  Psychiatric: She has a normal mood and affect. Her behavior is normal.    ED Course  Procedures (including critical care time) Labs Review Labs Reviewed  CBC WITH DIFFERENTIAL - Abnormal; Notable for the following:    WBC 11.7 (*)    Hemoglobin 15.7 (*)    All other components within normal limits  COMPREHENSIVE METABOLIC PANEL - Abnormal; Notable for the following:    Potassium 3.6 (*)    GFR calc non Af Amer 80 (*)    All other components within normal limits  LIPASE, BLOOD - Abnormal; Notable for the following:    Lipase 66 (*)    All other components within normal limits  URINALYSIS, ROUTINE W REFLEX MICROSCOPIC - Abnormal; Notable for the following:    APPearance CLOUDY (*)    Ketones, ur 15 (*)    Protein, ur 30 (*)    Leukocytes, UA LARGE (*)    All other components within normal limits  URINE MICROSCOPIC-ADD ON - Abnormal; Notable for the following:    Squamous Epithelial / LPF MANY (*)    Bacteria, UA FEW (*)    All other components within normal limits  TROPONIN I   Imaging Review Ct Abdomen Pelvis W Contrast  10/09/2013   CLINICAL DATA:  Chest pain.  Nausea and vomiting.  Elevated lipase.  EXAM: CT ABDOMEN AND PELVIS WITH CONTRAST  TECHNIQUE: Multidetector CT imaging of the abdomen and pelvis was performed using the standard protocol following bolus administration of intravenous contrast.  CONTRAST:  165mL OMNIPAQUE IOHEXOL 300 MG/ML  SOLN  COMPARISON:  CT of the abdomen and pelvis 09/28/2013.  FINDINGS: Lung Bases: Unremarkable.   Abdomen/Pelvis: Status post cholecystectomy. The appearance of the liver, pancreas, spleen, bilateral adrenal glands and the left kidney is unremarkable. Sub cm low-attenuation lesion in the right kidney is too small to definitively characterize, but is statistically likely to represent a tiny cyst. Atherosclerosis in the abdominal vasculature, without evidence of aneurysm. Mild colonic wall thickening and increased mucosal enhancement, most apparent in the ascending colon, hepatic flexure and proximal transverse colon. No significant volume of ascites. No pneumoperitoneum. No pathologic distention of small bowel. No definite lymphadenopathy identified  within the abdomen or pelvis. Uterus and ovaries are unremarkable in appearance. Urinary bladder is normal in appearance.  Musculoskeletal: There are no aggressive appearing lytic or blastic lesions noted in the visualized portions of the skeleton.  IMPRESSION: 1. Mild colonic wall thickening and increased mucosal enhancement, suggestive of a colitis, as above. 2. No other acute abnormalities. Specifically, the pancreas is grossly normal in appearance on today's examination. 3. Status post cholecystectomy.   Electronically Signed   By: Vinnie Langton M.D.   On: 10/09/2013 01:27     EKG Interpretation   Date/Time:  Sunday October 08 2013 20:13:07 EDT Ventricular Rate:  76 PR Interval:  141 QRS Duration: 84 QT Interval:  383 QTC Calculation: 431 R Axis:   -14 Text Interpretation:  Sinus rhythm Borderline T abnormalities, anterior  leads No significant change since last tracing Confirmed by Ashok Cordia  MD,  Lennette Bihari ([id number]) on 10/08/2013 11:15:08 PM      MDM   Final diagnoses:  Colitis  UTI (lower urinary tract infection)   Patient presents to ED with persistent nausea and vomiting. Abd is soft and generally tender with any focal findings. No rebound or guarding. CT scan shows colitis. Will treat with cipro and flagyl which will given coverage against  patient's UTI. Will also discharge patient with zofran, phenergan, and reglan. Return instructions given. Vital signs stable for discharge. Discussed [place] with Dr. Lita Mains who agrees with plan. Patient / Family / Caregiver informed of clinical course, understand medical decision-making process, and agree with plan.     Elwyn Lade, PA-C 10/09/13 1336

## 2013-10-08 NOTE — ED Provider Notes (Signed)
Medical screening examination/treatment/procedure(s) were conducted as a shared visit with non-physician practitioner(s) and myself.  I personally evaluated the patient during the encounter.   EKG Interpretation   Date/Time:  Friday October 06 2013 23:14:04 EDT Ventricular Rate:  70 PR Interval:  173 QRS Duration: 87 QT Interval:  444 QTC Calculation: 479 R Axis:   16 Text Interpretation:  Sinus rhythm Nonspecific T abnormalities, lateral  leads ED PHYSICIAN INTERPRETATION AVAILABLE IN [facility] Confirmed  by TEST, Record ([id number]) on 10/08/2013 9:21:00 AM      Pt with nv, abd cramping. abd soft nt. abd currently soft nt.     Mirna Mires, MD 10/08/13 [phone]

## 2013-10-09 ENCOUNTER — Emergency Department (HOSPITAL_COMMUNITY): Payer: Managed Care, Other (non HMO)

## 2013-10-09 ENCOUNTER — Encounter (HOSPITAL_COMMUNITY): Payer: Self-pay

## 2013-10-09 LAB — URINALYSIS, ROUTINE W REFLEX MICROSCOPIC
Bilirubin Urine: NEGATIVE
Glucose, UA: NEGATIVE mg/dL
Hgb urine dipstick: NEGATIVE
Ketones, ur: 15 mg/dL — AB
Nitrite: NEGATIVE
PROTEIN: 30 mg/dL — AB
Specific Gravity, Urine: 1.022 (ref 1.005–1.030)
UROBILINOGEN UA: 0.2 mg/dL (ref 0.0–1.0)
pH: 6 (ref 5.0–8.0)

## 2013-10-09 LAB — URINE MICROSCOPIC-ADD ON

## 2013-10-09 MED ORDER — PROMETHAZINE HCL 25 MG PO TABS: 25.0000 mg | ORAL_TABLET | Freq: Four times a day (QID) | ORAL | Status: DC | PRN

## 2013-10-09 MED ORDER — ONDANSETRON HCL 4 MG PO TABS: 4.0000 mg | ORAL_TABLET | Freq: Four times a day (QID) | ORAL | Status: DC

## 2013-10-09 MED ORDER — CIPROFLOXACIN HCL 500 MG PO TABS: 500.0000 mg | ORAL_TABLET | Freq: Two times a day (BID) | ORAL | Status: DC

## 2013-10-09 MED ORDER — PROMETHAZINE HCL 25 MG/ML IJ SOLN
25.0000 mg | Freq: Once | INTRAMUSCULAR | Status: AC
  Administered 2013-10-09: 25 mg via INTRAVENOUS
  Filled 2013-10-09: qty 1

## 2013-10-09 MED ORDER — METOCLOPRAMIDE HCL 10 MG PO TABS: 10.0000 mg | ORAL_TABLET | Freq: Three times a day (TID) | ORAL | Status: DC | PRN

## 2013-10-09 MED ORDER — HYDROMORPHONE HCL PF 1 MG/ML IJ SOLN
INTRAMUSCULAR | Status: AC
  Filled 2013-10-09: qty 1

## 2013-10-09 MED ORDER — LORAZEPAM 2 MG/ML IJ SOLN
2.0000 mg | Freq: Once | INTRAMUSCULAR | Status: AC
Start: 2013-10-09 — End: 2013-10-09
  Administered 2013-10-09: 2 mg via INTRAVENOUS
  Filled 2013-10-09: qty 1

## 2013-10-09 MED ORDER — IOHEXOL 300 MG/ML  SOLN
100.0000 mL | Freq: Once | INTRAMUSCULAR | Status: AC | PRN
  Administered 2013-10-09: 100 mL via INTRAVENOUS

## 2013-10-09 MED ORDER — IOHEXOL 300 MG/ML  SOLN
50.0000 mL | Freq: Once | INTRAMUSCULAR | Status: AC | PRN
  Administered 2013-10-09: 50 mL via ORAL

## 2013-10-09 MED ORDER — HYDROMORPHONE HCL PF 1 MG/ML IJ SOLN
1.0000 mg | Freq: Once | INTRAMUSCULAR | Status: AC
  Administered 2013-10-09: 1 mg via INTRAVENOUS

## 2013-10-09 MED ORDER — METRONIDAZOLE 500 MG PO TABS: 500.0000 mg | ORAL_TABLET | Freq: Two times a day (BID) | ORAL | Status: DC

## 2013-10-09 NOTE — Discharge Instructions (Signed)
Urinary Tract Infection A urinary tract infection (UTI) can occur any place along the urinary tract. The tract includes the kidneys, ureters, bladder, and urethra. A type of germ called bacteria often causes a UTI. UTIs are often helped with antibiotic medicine.  HOME CARE   If given, take antibiotics as told by your doctor. Finish them even if you start to feel better.  Drink enough fluids to keep your pee (urine) clear or pale yellow.  Avoid tea, drinks with caffeine, and bubbly (carbonated) drinks.  Pee often. Avoid holding your pee in for a long time.  Pee before and after having sex (intercourse).  Wipe from front to back after you poop (bowel movement) if you are a woman. Use each tissue only once. GET HELP RIGHT AWAY IF:   You have back pain.  You have lower belly (abdominal) pain.  You have chills.  You feel sick to your stomach (nauseous).  You throw up (vomit).  Your burning or discomfort with peeing does not go away.  You have a fever.  Your symptoms are not better in 3 days. MAKE SURE YOU:   Understand these instructions.  Will watch your condition.  Will get help right away if you are not doing well or get worse. Document Released: 12/23/2007 Document Revised: 03/30/2012 Document Reviewed: 02/04/2012 [facility] Patient Information 2014 [place], [place].  Colitis Colitis is inflammation of the colon. Colitis can be a short-term or long-standing (chronic) illness. Crohn's disease and ulcerative colitis are 2 types of colitis which are chronic. They usually require lifelong treatment. CAUSES  There are many different causes of colitis, including:  Viruses.  Germs (bacteria).  Medicine reactions. SYMPTOMS   Diarrhea.  Intestinal bleeding.  Pain.  Fever.  Throwing up (vomiting).  Tiredness (fatigue).  Weight loss.  Bowel blockage. DIAGNOSIS  The diagnosis of colitis is based on examination and stool or blood tests. X-rays, CT scan, and  colonoscopy may also be needed. TREATMENT  Treatment may include:  Fluids given through the vein (intravenously).  Bowel rest (nothing to eat or drink for a period of time).  Medicine for pain and diarrhea.  Medicines (antibiotics) that kill germs.  Cortisone medicines.  Surgery. HOME CARE INSTRUCTIONS   Get plenty of rest.  Drink enough water and fluids to keep your urine clear or pale yellow.  Eat a well-balanced diet.  Call your caregiver for follow-up as recommended. SEEK IMMEDIATE MEDICAL CARE IF:   You develop chills.  You have an oral temperature above 102 F (38.9 C), not controlled by medicine.  You have extreme weakness, fainting, or dehydration.  You have repeated vomiting.  You develop severe belly (abdominal) pain or are passing bloody or tarry stools. MAKE SURE YOU:   Understand these instructions.  Will watch your condition.  Will get help right away if you are not doing well or get worse. Document Released: 08/13/2004 Document Revised: 09/28/2011 Document Reviewed: 11/08/2009 [facility] Patient Information 2014 [place], [place].

## 2013-10-22 NOTE — ED Provider Notes (Signed)
Medical screening examination/treatment/procedure(s) were performed by non-physician practitioner and as supervising physician I was immediately available for consultation/collaboration.   EKG Interpretation   Date/Time:  Sunday October 08 2013 20:13:07 EDT Ventricular Rate:  76 PR Interval:  141 QRS Duration: 84 QT Interval:  383 QTC Calculation: 431 R Axis:   -14 Text Interpretation:  Sinus rhythm Borderline T abnormalities, anterior  leads No significant change since last tracing Confirmed by Ashok Cordia  MD,  Lennette Bihari ([id number]) on 10/08/2013 11:15:08 PM        Julianne Rice, MD 10/22/13 2304

## 2014-03-02 ENCOUNTER — Emergency Department (HOSPITAL_COMMUNITY)
Admission: EM | Admit: 2014-03-02 | Discharge: 2014-03-02 | Disposition: A | Discharge status: Home / Self Care | Payer: Managed Care, Other (non HMO) | Attending: Emergency Medicine | Admitting: Emergency Medicine

## 2014-03-02 ENCOUNTER — Encounter (HOSPITAL_COMMUNITY): Payer: Self-pay | Admitting: Emergency Medicine

## 2014-03-02 DIAGNOSIS — G40909 Epilepsy, unspecified, not intractable, without status epilepticus: Secondary | ICD-10-CM | POA: Insufficient documentation

## 2014-03-02 DIAGNOSIS — Z79899 Other long term (current) drug therapy: Secondary | ICD-10-CM | POA: Diagnosis not present

## 2014-03-02 DIAGNOSIS — K089 Disorder of teeth and supporting structures, unspecified: Secondary | ICD-10-CM | POA: Diagnosis present

## 2014-03-02 DIAGNOSIS — F411 Generalized anxiety disorder: Secondary | ICD-10-CM | POA: Insufficient documentation

## 2014-03-02 DIAGNOSIS — G43909 Migraine, unspecified, not intractable, without status migrainosus: Secondary | ICD-10-CM | POA: Diagnosis not present

## 2014-03-02 DIAGNOSIS — M129 Arthropathy, unspecified: Secondary | ICD-10-CM | POA: Insufficient documentation

## 2014-03-02 DIAGNOSIS — E039 Hypothyroidism, unspecified: Secondary | ICD-10-CM | POA: Diagnosis not present

## 2014-03-02 DIAGNOSIS — K029 Dental caries, unspecified: Secondary | ICD-10-CM | POA: Insufficient documentation

## 2014-03-02 DIAGNOSIS — F17200 Nicotine dependence, unspecified, uncomplicated: Secondary | ICD-10-CM | POA: Diagnosis not present

## 2014-03-02 DIAGNOSIS — Z8719 Personal history of other diseases of the digestive system: Secondary | ICD-10-CM | POA: Insufficient documentation

## 2014-03-02 DIAGNOSIS — Z87442 Personal history of urinary calculi: Secondary | ICD-10-CM | POA: Insufficient documentation

## 2014-03-02 DIAGNOSIS — K0889 Other specified disorders of teeth and supporting structures: Secondary | ICD-10-CM

## 2014-03-02 MED ORDER — ACETAMINOPHEN-CODEINE #3 300-30 MG PO TABS: 1.0000 | ORAL_TABLET | Freq: Four times a day (QID) | ORAL | Status: DC | PRN

## 2014-03-02 NOTE — ED Provider Notes (Signed)
CSN: [id number]     Arrival date & time 03/02/14  1114 History   First MD Initiated Contact with Patient 03/02/14 1140     Chief Complaint  Patient presents with  . Dental Pain     (Consider location/radiation/quality/duration/timing/severity/associated sxs/prior Treatment) HPI Comments: Patient states that she is here because of dental pain. The patient states she has dental caries involving all of her remaining teeth. She is in the process of having a procedure done in [place] on next week. She has been treated with antibiotics. She has been taking Tylenol and ibuprofen without improvement. The patient states that she went to see a dentist today and was told come to the emergency department for assistance with her pain. She states that the pain is keeping her up at night and is interfering with her activities of daily living. She denies any high fever. She's had some nausea, but no vomiting.  The history is provided by the patient.    Past Medical History  Diagnosis Date  . Arthritis   . Fibromyalgia   . Allergy     shell fish  . Anxiety   . Thyroid disease     hypothryroid  . GERD (gastroesophageal reflux disease)   . Renal disorder     kidney stones  . Seizures     from Cymbalta   Past Surgical History  Procedure Laterality Date  . Cholecystectomy     Family History  Problem Relation Age of Onset  . Prostate cancer Father   . Heart disease Father   . Colon cancer Paternal Aunt   . Colon cancer Paternal Aunt    History  Substance Use Topics  . Smoking status: Current Every Day Smoker -- 0.50 packs/day    Types: Cigarettes  . Smokeless tobacco: Never Used  . Alcohol Use: No   OB History   Grav Para Term Preterm Abortions TAB SAB Ect Mult Living                 Review of Systems  Constitutional: Negative for activity change.       All ROS Neg except as noted in HPI  HENT: Positive for dental problem. Negative for nosebleeds.   Eyes: Negative for  photophobia and discharge.  Respiratory: Negative for cough, shortness of breath and wheezing.   Cardiovascular: Negative for chest pain and palpitations.  Gastrointestinal: Negative for abdominal pain and blood in stool.  Genitourinary: Negative for dysuria, frequency and hematuria.  Musculoskeletal: Positive for arthralgias. Negative for back pain and neck pain.  Skin: Negative.   Neurological: Positive for seizures. Negative for dizziness and speech difficulty.  Psychiatric/Behavioral: Negative for hallucinations and confusion. The patient is nervous/anxious.       Allergies  Iodine; Shellfish allergy; and Nsaids  Home Medications   Prior to Admission medications   Medication Sig Start Date End Date Taking? Authorizing Provider  clonazePAM (KLONOPIN) 1 MG tablet Take 1 mg by mouth 3 (three) times daily.   Yes Historical Provider, MD  HYDROcodone-acetaminophen (NORCO) 10-325 MG per tablet Take 1 tablet by mouth every 6 (six) hours as needed for moderate pain.   Yes Historical Provider, MD  levothyroxine (SYNTHROID, LEVOTHROID) 25 MCG tablet Take 25 mcg by mouth daily before breakfast.   Yes Historical Provider, MD   BP 145/106  Pulse 90  Temp(Src) 97.9 F (36.6 C) (Oral)  Resp 19  Ht 5\' 4"  (1.626 m)  Wt 120 lb (54.432 kg)  BMI 20.59 kg/m2  SpO2 100%  Physical Exam  Nursing note and vitals reviewed. Constitutional: She is oriented to person, place, and time. She appears well-developed and well-nourished.  Non-toxic appearance.  HENT:  Head: Normocephalic.  Right Ear: Tympanic membrane and external ear normal.  Left Ear: Tympanic membrane and external ear normal.  Multiple dental caries of the upper and lower jaws. Swelling of the gums noted. No visible abscess appreciated. The airway is patent. There is no swelling under the tongue.  Eyes: EOM and lids are normal. Pupils are equal, round, and reactive to light.  Neck: Normal range of motion. Neck supple. Carotid bruit is not  present.  Cardiovascular: Normal rate, regular rhythm, normal heart sounds, intact distal pulses and normal pulses.   No murmur heard. Pulmonary/Chest: Breath sounds normal. No respiratory distress.  Abdominal: Soft. Bowel sounds are normal. There is no tenderness. There is no guarding.  Musculoskeletal: Normal range of motion.  Lymphadenopathy:       Head (right side): No submandibular adenopathy present.       Head (left side): No submandibular adenopathy present.    She has no cervical adenopathy.  Neurological: She is alert and oriented to person, place, and time. She has normal strength. No cranial nerve deficit or sensory deficit.  Skin: Skin is warm and dry.  Psychiatric: She has a normal mood and affect. Her speech is normal.    ED Course  Procedures (including critical care time) Labs Review Labs Reviewed - No data to display  Imaging Review No results found.   EKG Interpretation None      MDM Patient has multiple dental caries of the upper and lower jaw. Vital signs are within normal limits with exception of the blood pressure being 145/106.  Patient is advised to continue her antibiotics. Prescription for Tylenol with Codeine given to the patient to use for her discomfort. Patient is to see her dentist next week for additional evaluation and treatment.    Final diagnoses:  None    *I have reviewed nursing notes, vital signs, and all appropriate lab and imaging results for this patient.Lenox Ahr, PA-C 03/02/14 1252

## 2014-03-02 NOTE — ED Provider Notes (Signed)
Medical screening examination/treatment/procedure(s) were performed by non-physician practitioner and as supervising physician I was immediately available for consultation/collaboration.   EKG Interpretation None       Nat Christen, MD 03/02/14 1306

## 2014-03-02 NOTE — Discharge Instructions (Signed)
Please continue your antibiotics. Please use Tylenol codeine for assistance with her pain not controlled by Tylenol extra strength. Tylenol codeine cause drowsiness, please use this medication with caution. Please take this medication with food. Dental Pain Toothache is pain in or around a tooth. It may get worse with chewing or with cold or heat.  HOME CARE  Your dentist may use a numbing medicine during treatment. If so, you may need to avoid eating until the medicine wears off. Ask your dentist about this.  Only take medicine as told by your dentist or doctor.  Avoid chewing food near the painful tooth until after all treatment is done. Ask your dentist about this. GET HELP RIGHT AWAY IF:   The problem gets worse or new problems appear.  You have a fever.  There is redness and puffiness (swelling) of the face, jaw, or neck.  You cannot open your mouth.  There is pain in the jaw.  There is very bad pain that is not helped by medicine. MAKE SURE YOU:   Understand these instructions.  Will watch your condition.  Will get help right away if you are not doing well or get worse. Document Released: 12/23/2007 Document Revised: 09/28/2011 Document Reviewed: 12/23/2007 [facility] Patient Information 2015 [place], [place]. This information is not intended to replace advice given to you by your health care provider. Make sure you discuss any questions you have with your health care provider.

## 2014-03-02 NOTE — ED Notes (Signed)
Toothache.  Has appointment next week for dentist.

## 2014-04-19 ENCOUNTER — Ambulatory Visit (INDEPENDENT_AMBULATORY_CARE_PROVIDER_SITE_OTHER): Payer: Managed Care, Other (non HMO) | Admitting: Otolaryngology

## 2014-04-19 DIAGNOSIS — K1123 Chronic sialoadenitis: Secondary | ICD-10-CM

## 2014-06-25 ENCOUNTER — Encounter (HOSPITAL_COMMUNITY): Payer: Self-pay | Admitting: *Deleted

## 2014-06-25 ENCOUNTER — Emergency Department (HOSPITAL_COMMUNITY): Payer: Managed Care, Other (non HMO)

## 2014-06-25 ENCOUNTER — Emergency Department (HOSPITAL_COMMUNITY)
Admission: EM | Admit: 2014-06-25 | Discharge: 2014-06-26 | Disposition: A | Discharge status: Home / Self Care | Payer: Managed Care, Other (non HMO) | Attending: Emergency Medicine | Admitting: Emergency Medicine

## 2014-06-25 DIAGNOSIS — Z792 Long term (current) use of antibiotics: Secondary | ICD-10-CM | POA: Insufficient documentation

## 2014-06-25 DIAGNOSIS — Z8669 Personal history of other diseases of the nervous system and sense organs: Secondary | ICD-10-CM | POA: Diagnosis not present

## 2014-06-25 DIAGNOSIS — Z8719 Personal history of other diseases of the digestive system: Secondary | ICD-10-CM | POA: Diagnosis not present

## 2014-06-25 DIAGNOSIS — Z8659 Personal history of other mental and behavioral disorders: Secondary | ICD-10-CM | POA: Insufficient documentation

## 2014-06-25 DIAGNOSIS — R109 Unspecified abdominal pain: Secondary | ICD-10-CM

## 2014-06-25 DIAGNOSIS — M1990 Unspecified osteoarthritis, unspecified site: Secondary | ICD-10-CM | POA: Diagnosis not present

## 2014-06-25 DIAGNOSIS — E039 Hypothyroidism, unspecified: Secondary | ICD-10-CM | POA: Insufficient documentation

## 2014-06-25 DIAGNOSIS — R110 Nausea: Secondary | ICD-10-CM | POA: Diagnosis not present

## 2014-06-25 DIAGNOSIS — Z87442 Personal history of urinary calculi: Secondary | ICD-10-CM | POA: Insufficient documentation

## 2014-06-25 DIAGNOSIS — Z720 Tobacco use: Secondary | ICD-10-CM | POA: Diagnosis not present

## 2014-06-25 DIAGNOSIS — N390 Urinary tract infection, site not specified: Secondary | ICD-10-CM | POA: Diagnosis not present

## 2014-06-25 DIAGNOSIS — Z79899 Other long term (current) drug therapy: Secondary | ICD-10-CM | POA: Diagnosis not present

## 2014-06-25 LAB — URINALYSIS, ROUTINE W REFLEX MICROSCOPIC
Bilirubin Urine: NEGATIVE
GLUCOSE, UA: NEGATIVE mg/dL
Ketones, ur: NEGATIVE mg/dL
Nitrite: NEGATIVE
Protein, ur: NEGATIVE mg/dL
Specific Gravity, Urine: 1.01 (ref 1.005–1.030)
Urobilinogen, UA: 0.2 mg/dL (ref 0.0–1.0)
pH: 6 (ref 5.0–8.0)

## 2014-06-25 LAB — BASIC METABOLIC PANEL
ANION GAP: 16 — AB (ref 5–15)
BUN: 5 mg/dL — ABNORMAL LOW (ref 6–23)
CO2: 23 meq/L (ref 19–32)
CREATININE: 0.84 mg/dL (ref 0.50–1.10)
Calcium: 9.3 mg/dL (ref 8.4–10.5)
Chloride: 101 mEq/L (ref 96–112)
GFR calc Af Amer: 90 mL/min — ABNORMAL LOW (ref >90)
GFR calc non Af Amer: 77 mL/min — ABNORMAL LOW (ref >90)
Glucose, Bld: 107 mg/dL — ABNORMAL HIGH (ref 70–99)
POTASSIUM: 3.4 meq/L — AB (ref 3.7–5.3)
Sodium: 140 mEq/L (ref 137–147)

## 2014-06-25 LAB — CBC
HEMATOCRIT: 41 % (ref 36.0–46.0)
Hemoglobin: 13.9 g/dL (ref 12.0–15.0)
MCH: 32.9 pg (ref 26.0–34.0)
MCHC: 33.9 g/dL (ref 30.0–36.0)
MCV: 97.2 fL (ref 78.0–100.0)
Platelets: 305 10*3/uL (ref 150–400)
RBC: 4.22 MIL/uL (ref 3.87–5.11)
RDW: 13.2 % (ref 11.5–15.5)
WBC: 9.8 10*3/uL (ref 4.0–10.5)

## 2014-06-25 LAB — URINE MICROSCOPIC-ADD ON

## 2014-06-25 MED ORDER — ONDANSETRON HCL 4 MG/2ML IJ SOLN
4.0000 mg | Freq: Once | INTRAMUSCULAR | Status: AC
  Administered 2014-06-25: 4 mg via INTRAVENOUS
  Filled 2014-06-25: qty 2

## 2014-06-25 MED ORDER — CEFTRIAXONE SODIUM 1 G IJ SOLR
1.0000 g | Freq: Once | INTRAMUSCULAR | Status: AC
  Administered 2014-06-25: 1 g via INTRAVENOUS
  Filled 2014-06-25: qty 10

## 2014-06-25 MED ORDER — ONDANSETRON HCL 4 MG PO TABS: 4.0000 mg | ORAL_TABLET | Freq: Three times a day (TID) | ORAL | Status: DC | PRN

## 2014-06-25 MED ORDER — KETOROLAC TROMETHAMINE 30 MG/ML IJ SOLN
30.0000 mg | Freq: Once | INTRAMUSCULAR | Status: AC
  Administered 2014-06-25: 30 mg via INTRAVENOUS
  Filled 2014-06-25: qty 1

## 2014-06-25 MED ORDER — HYDROCODONE-ACETAMINOPHEN 5-325 MG PO TABS
2.0000 | ORAL_TABLET | ORAL | Status: DC | PRN
Start: 2014-06-25 — End: 2015-03-27

## 2014-06-25 MED ORDER — HYDROMORPHONE HCL 1 MG/ML IJ SOLN
1.0000 mg | Freq: Once | INTRAMUSCULAR | Status: AC
  Administered 2014-06-25: 1 mg via INTRAVENOUS
  Filled 2014-06-25: qty 1

## 2014-06-25 MED ORDER — CEPHALEXIN 500 MG PO CAPS: 500.0000 mg | ORAL_CAPSULE | Freq: Four times a day (QID) | ORAL | Status: DC

## 2014-06-25 NOTE — ED Notes (Signed)
Pt c/o right flank pain x 5 days; pt states she has a hx of kidney stones and this is how it feels

## 2014-06-25 NOTE — ED Provider Notes (Signed)
CSN: [id number]     Arrival date & time 06/25/14  2033 History  This chart was scribed for Johnna Acosta, MD by Rayfield Citizen, ED Scribe. This patient was seen in room APA12/AP[facility]12 and the patient's care was started at 9:26 PM.    Chief Complaint  Patient presents with  . Flank Pain   The history is provided by the patient. No language interpreter was used.     HPI Comments: Yolanda [name] is a 53 y.o. female with past medical history of kidney stones who presents to the Emergency Department complaining of 5 days of waxing and waning dull right flank pain radiating around to the front of her body. This has acutely worsened in the past 24 hours; it is now described as a stabbing pain. Nothing makes symptoms better or worse. She reports nausea. She denies hematuria, vomiting, ovarian cysts, kidney infection. Patient reports that her current pain is similar to previous experiences with kidney stones.   Per records, last imaging study in March 2015; no kidney stones visualized at that time. Patient has a surgical history of cholecystectomy.   Past Medical History  Diagnosis Date  . Arthritis   . Fibromyalgia   . Allergy     shell fish  . Anxiety   . Thyroid disease     hypothryroid  . GERD (gastroesophageal reflux disease)   . Renal disorder     kidney stones  . Seizures     from Cymbalta   Past Surgical History  Procedure Laterality Date  . Cholecystectomy     Family History  Problem Relation Age of Onset  . Prostate cancer Father   . Heart disease Father   . Colon cancer Paternal Aunt   . Colon cancer Paternal Aunt    History  Substance Use Topics  . Smoking status: Current Every Day Smoker -- 0.50 packs/day    Types: Cigarettes  . Smokeless tobacco: Never Used  . Alcohol Use: No   OB History    No data available     Review of Systems  Gastrointestinal: Positive for nausea.  Genitourinary: Positive for flank pain.  All other systems reviewed and are  negative.     Allergies  Iodine; Shellfish allergy; and Nsaids  Home Medications   Prior to Admission medications   Medication Sig Start Date End Date Taking? Authorizing Provider  acetaminophen-codeine (TYLENOL #3) 300-30 MG per tablet Take 1-2 tablets by mouth every 6 (six) hours as needed. 03/02/14  Yes Lenox Ahr, PA-C  CALCIUM PO Take 1 tablet by mouth daily.   Yes Historical Provider, MD  cholecalciferol (VITAMIN D) 1000 UNITS tablet Take 1,000 Units by mouth daily.   Yes Historical Provider, MD  levothyroxine (SYNTHROID, LEVOTHROID) 25 MCG tablet Take 25 mcg by mouth daily before breakfast.   Yes Historical Provider, MD  Multiple Vitamins-Minerals (MULTI ADULT GUMMIES PO) Take 2 each by mouth daily.   Yes Historical Provider, MD  cephALEXin (KEFLEX) 500 MG capsule Take 1 capsule (500 mg total) by mouth 4 (four) times daily. 06/25/14   Johnna Acosta, MD  HYDROcodone-acetaminophen (NORCO/VICODIN) 5-325 MG per tablet Take 2 tablets by mouth every 4 (four) hours as needed. 06/25/14   Johnna Acosta, MD  ondansetron (ZOFRAN) 4 MG tablet Take 1 tablet (4 mg total) by mouth every 8 (eight) hours as needed for nausea or vomiting. 06/25/14   Johnna Acosta, MD   BP 144/104 mmHg  Pulse 90  Temp(Src) 98.5 F (  36.9 C) (Oral)  Resp 20  Ht 5\' 4"  (1.626 m)  Wt 125 lb (56.7 kg)  BMI 21.45 kg/m2  SpO2 100% Physical Exam  Constitutional: She appears well-developed and well-nourished. No distress.  HENT:  Head: Normocephalic and atraumatic.  Mouth/Throat: Oropharynx is clear and moist. No oropharyngeal exudate.  Eyes: Conjunctivae and EOM are normal. Pupils are equal, round, and reactive to light. Right eye exhibits no discharge. Left eye exhibits no discharge. No scleral icterus.  Neck: Normal range of motion. Neck supple. No JVD present. No thyromegaly present.  Cardiovascular: Normal rate, regular rhythm, normal heart sounds and intact distal pulses.  Exam reveals no gallop and no  friction rub.   No murmur heard. Pulmonary/Chest: Effort normal and breath sounds normal. No respiratory distress. She has no wheezes. She has no rales.  Abdominal: Soft. Bowel sounds are normal. She exhibits no distension and no mass. There is no tenderness.  Genitourinary:  CVA tenderness on right flank  Musculoskeletal: Normal range of motion. She exhibits no edema or tenderness.  Lymphadenopathy:    She has no cervical adenopathy.  Neurological: She is alert. Coordination normal.  Skin: Skin is warm and dry. No rash noted. No erythema.  Psychiatric: She has a normal mood and affect. Her behavior is normal.  Nursing note and vitals reviewed.   ED Course  Procedures   DIAGNOSTIC STUDIES: Oxygen Saturation is 100% on Ra, normal by my interpretation.    COORDINATION OF CARE: 9:30 PM Discussed treatment plan with pt at bedside and pt agreed to plan.   Labs Review Labs Reviewed  URINALYSIS, ROUTINE W REFLEX MICROSCOPIC - Abnormal; Notable for the following:    Hgb urine dipstick TRACE (*)    Leukocytes, UA LARGE (*)    All other components within normal limits  BASIC METABOLIC PANEL - Abnormal; Notable for the following:    Potassium 3.4 (*)    Glucose, Bld 107 (*)    BUN 5 (*)    GFR calc non Af Amer 77 (*)    GFR calc Af Amer 90 (*)    Anion gap 16 (*)    All other components within normal limits  URINE MICROSCOPIC-ADD ON - Abnormal; Notable for the following:    Squamous Epithelial / LPF FEW (*)    Bacteria, UA FEW (*)    All other components within normal limits  URINE CULTURE  CBC    Imaging Review Ct Renal Stone Study  06/25/2014   CLINICAL DATA:  Right flank pain beginning 4-5 days ago. History of kidney stones.  EXAM: CT ABDOMEN AND PELVIS WITHOUT CONTRAST  TECHNIQUE: Multidetector CT imaging of the abdomen and pelvis was performed following the standard protocol without IV contrast.  COMPARISON:  10/09/2013.  FINDINGS: No ureteral stone. No hydronephrosis or  evidence of obstructive uropathy. 4 mm nonobstructing stone lies in the midpole the right kidney. No other intrarenal stones. No renal masses. No bladder mass or stone.  Clear lung bases.  Heart is normal in size.  Liver, spleen pancreas: Unremarkable. Gallbladder surgically absent. No bile duct dilation. No adrenal masses.  Uterus and adnexa are unremarkable. No adenopathy. No abnormal fluid collections.  There are few left colon diverticula. No diverticulitis/colonic inflammation. Colon otherwise unremarkable. Normal small bowel. Normal appendix.  There are degenerative changes of the visualized spine. No osteoblastic or osteolytic lesions.  IMPRESSION: 1. No acute findings. No findings to explain right flank pain. No ureteral stones or obstructive uropathy. 2. 4 mm nonobstructing stone  in the right kidney, stable.  This 3. Status post cholecystectomy. 4. Degenerative changes noted of the visualized spine.   Electronically Signed   By: Lajean Manes M.D.   On: 06/25/2014 22:22      MDM   Final diagnoses:  Flank pain  UTI (lower urinary tract infection)    Labs show urinary tract infection, no other acute findings, patient improved with medications, no signs of kidney stone, stable for discharge on the following medications.   Meds given in ED:  Medications  cefTRIAXone (ROCEPHIN) 1 g in dextrose 5 % 50 mL IVPB (not administered)  HYDROmorphone (DILAUDID) injection 1 mg (1 mg Intravenous Given 06/25/14 2154)  ketorolac (TORADOL) 30 MG/ML injection 30 mg (30 mg Intravenous Given 06/25/14 2155)  ondansetron (ZOFRAN) injection 4 mg (4 mg Intravenous Given 06/25/14 2155)    New Prescriptions   CEPHALEXIN (KEFLEX) 500 MG CAPSULE    Take 1 capsule (500 mg total) by mouth 4 (four) times daily.   HYDROCODONE-ACETAMINOPHEN (NORCO/VICODIN) 5-325 MG PER TABLET    Take 2 tablets by mouth every 4 (four) hours as needed.   ONDANSETRON (ZOFRAN) 4 MG TABLET    Take 1 tablet (4 mg total) by mouth every 8  (eight) hours as needed for nausea or vomiting.      I personally performed the services described in this documentation, which was scribed in my presence. The recorded information has been reviewed and is accurate.      Johnna Acosta, MD 06/25/14 [phone]

## 2014-06-25 NOTE — Discharge Instructions (Signed)

## 2014-06-27 LAB — URINE CULTURE

## 2015-03-27 ENCOUNTER — Emergency Department (HOSPITAL_COMMUNITY)
Admission: EM | Admit: 2015-03-27 | Discharge: 2015-03-27 | Disposition: A | Discharge status: Home / Self Care | Payer: Managed Care, Other (non HMO) | Attending: Emergency Medicine | Admitting: Emergency Medicine

## 2015-03-27 ENCOUNTER — Encounter (HOSPITAL_COMMUNITY): Payer: Self-pay | Admitting: Emergency Medicine

## 2015-03-27 ENCOUNTER — Emergency Department (HOSPITAL_COMMUNITY): Payer: Managed Care, Other (non HMO)

## 2015-03-27 DIAGNOSIS — K625 Hemorrhage of anus and rectum: Secondary | ICD-10-CM | POA: Diagnosis not present

## 2015-03-27 DIAGNOSIS — Z9049 Acquired absence of other specified parts of digestive tract: Secondary | ICD-10-CM | POA: Insufficient documentation

## 2015-03-27 DIAGNOSIS — Z720 Tobacco use: Secondary | ICD-10-CM | POA: Diagnosis not present

## 2015-03-27 DIAGNOSIS — K529 Noninfective gastroenteritis and colitis, unspecified: Secondary | ICD-10-CM | POA: Diagnosis not present

## 2015-03-27 DIAGNOSIS — Z87442 Personal history of urinary calculi: Secondary | ICD-10-CM | POA: Diagnosis not present

## 2015-03-27 DIAGNOSIS — Z8639 Personal history of other endocrine, nutritional and metabolic disease: Secondary | ICD-10-CM | POA: Diagnosis not present

## 2015-03-27 DIAGNOSIS — Z87448 Personal history of other diseases of urinary system: Secondary | ICD-10-CM | POA: Diagnosis not present

## 2015-03-27 DIAGNOSIS — Z8659 Personal history of other mental and behavioral disorders: Secondary | ICD-10-CM | POA: Insufficient documentation

## 2015-03-27 DIAGNOSIS — Z79899 Other long term (current) drug therapy: Secondary | ICD-10-CM | POA: Insufficient documentation

## 2015-03-27 DIAGNOSIS — Z9851 Tubal ligation status: Secondary | ICD-10-CM | POA: Insufficient documentation

## 2015-03-27 DIAGNOSIS — R000 Tachycardia, unspecified: Secondary | ICD-10-CM | POA: Diagnosis not present

## 2015-03-27 DIAGNOSIS — M1990 Unspecified osteoarthritis, unspecified site: Secondary | ICD-10-CM | POA: Insufficient documentation

## 2015-03-27 DIAGNOSIS — R1030 Lower abdominal pain, unspecified: Secondary | ICD-10-CM | POA: Diagnosis present

## 2015-03-27 LAB — I-STAT CG4 LACTIC ACID, ED: Lactic Acid, Venous: 1.25 mmol/L (ref 0.5–2.0)

## 2015-03-27 LAB — COMPREHENSIVE METABOLIC PANEL
ALBUMIN: 3.9 g/dL (ref 3.5–5.0)
ALT: 27 U/L (ref 14–54)
ANION GAP: 10 (ref 5–15)
AST: 15 U/L (ref 15–41)
Alkaline Phosphatase: 98 U/L (ref 38–126)
BILIRUBIN TOTAL: 0.4 mg/dL (ref 0.3–1.2)
BUN: 13 mg/dL (ref 6–20)
CO2: 25 mmol/L (ref 22–32)
CREATININE: 1.04 mg/dL — AB (ref 0.44–1.00)
Calcium: 9.4 mg/dL (ref 8.9–10.3)
Chloride: 105 mmol/L (ref 101–111)
GFR calc Af Amer: >60 mL/min (ref >60)
GFR calc non Af Amer: 59 mL/min — ABNORMAL LOW (ref >60)
GLUCOSE: 126 mg/dL — AB (ref 65–99)
Potassium: 3.3 mmol/L — ABNORMAL LOW (ref 3.5–5.1)
SODIUM: 140 mmol/L (ref 135–145)
Total Protein: 8 g/dL (ref 6.5–8.1)

## 2015-03-27 LAB — URINE MICROSCOPIC-ADD ON

## 2015-03-27 LAB — CBC WITH DIFFERENTIAL/PLATELET
BASOS ABS: 0 10*3/uL (ref 0.0–0.1)
Basophils Relative: 0 % (ref 0–1)
EOS ABS: 0 10*3/uL (ref 0.0–0.7)
Eosinophils Relative: 0 % (ref 0–5)
HEMATOCRIT: 45.4 % (ref 36.0–46.0)
HEMOGLOBIN: 15.6 g/dL — AB (ref 12.0–15.0)
Lymphocytes Relative: 9 % — ABNORMAL LOW (ref 12–46)
Lymphs Abs: 1.8 10*3/uL (ref 0.7–4.0)
MCH: 33.2 pg (ref 26.0–34.0)
MCHC: 34.4 g/dL (ref 30.0–36.0)
MCV: 96.6 fL (ref 78.0–100.0)
MONOS PCT: 8 % (ref 3–12)
Monocytes Absolute: 1.6 10*3/uL — ABNORMAL HIGH (ref 0.1–1.0)
NEUTROS ABS: 17.2 10*3/uL — AB (ref 1.7–7.7)
Neutrophils Relative %: 83 % — ABNORMAL HIGH (ref 43–77)
Platelets: 226 10*3/uL (ref 150–400)
RBC: 4.7 MIL/uL (ref 3.87–5.11)
RDW: 12.7 % (ref 11.5–15.5)
WBC: 20.7 10*3/uL — ABNORMAL HIGH (ref 4.0–10.5)

## 2015-03-27 LAB — URINALYSIS, ROUTINE W REFLEX MICROSCOPIC
Bilirubin Urine: NEGATIVE
GLUCOSE, UA: NEGATIVE mg/dL
LEUKOCYTES UA: NEGATIVE
Nitrite: NEGATIVE
Urobilinogen, UA: 0.2 mg/dL (ref 0.0–1.0)
pH: 6.5 (ref 5.0–8.0)

## 2015-03-27 LAB — PROTIME-INR
INR: 1.07 (ref 0.00–1.49)
PROTHROMBIN TIME: 14.1 s (ref 11.6–15.2)

## 2015-03-27 LAB — LIPASE, BLOOD: Lipase: 15 U/L — ABNORMAL LOW (ref 22–51)

## 2015-03-27 LAB — POC OCCULT BLOOD, ED: FECAL OCCULT BLD: POSITIVE — AB

## 2015-03-27 MED ORDER — MORPHINE SULFATE (PF) 4 MG/ML IV SOLN
4.0000 mg | Freq: Once | INTRAVENOUS | Status: AC
  Administered 2015-03-27: 4 mg via INTRAVENOUS
  Filled 2015-03-27: qty 1

## 2015-03-27 MED ORDER — HYDROCODONE-ACETAMINOPHEN 5-325 MG PO TABS: 1.0000 | ORAL_TABLET | ORAL | Status: DC | PRN

## 2015-03-27 MED ORDER — CIPROFLOXACIN HCL 500 MG PO TABS: 500.0000 mg | ORAL_TABLET | Freq: Two times a day (BID) | ORAL | Status: DC

## 2015-03-27 MED ORDER — METRONIDAZOLE 500 MG PO TABS
500.0000 mg | ORAL_TABLET | Freq: Once | ORAL | Status: AC
  Administered 2015-03-27: 500 mg via ORAL
  Filled 2015-03-27: qty 1

## 2015-03-27 MED ORDER — IOHEXOL 300 MG/ML  SOLN
100.0000 mL | Freq: Once | INTRAMUSCULAR | Status: AC | PRN
  Administered 2015-03-27: 100 mL via INTRAVENOUS

## 2015-03-27 MED ORDER — SODIUM CHLORIDE 0.9 % IJ SOLN
INTRAMUSCULAR | Status: AC
  Filled 2015-03-27: qty 60

## 2015-03-27 MED ORDER — METRONIDAZOLE 500 MG PO TABS: 500.0000 mg | ORAL_TABLET | Freq: Four times a day (QID) | ORAL | Status: DC

## 2015-03-27 MED ORDER — ONDANSETRON HCL 4 MG/2ML IJ SOLN
4.0000 mg | Freq: Once | INTRAMUSCULAR | Status: AC
  Administered 2015-03-27: 4 mg via INTRAVENOUS
  Filled 2015-03-27: qty 2

## 2015-03-27 MED ORDER — IOHEXOL 300 MG/ML  SOLN
25.0000 mL | Freq: Once | INTRAMUSCULAR | Status: AC | PRN
  Administered 2015-03-27: 25 mL via ORAL

## 2015-03-27 MED ORDER — CIPROFLOXACIN IN D5W 400 MG/200ML IV SOLN
400.0000 mg | Freq: Once | INTRAVENOUS | Status: AC
  Administered 2015-03-27: 400 mg via INTRAVENOUS
  Filled 2015-03-27: qty 200

## 2015-03-27 MED ORDER — IOHEXOL 300 MG/ML  SOLN: 25.0000 mL | Freq: Once | INTRAMUSCULAR | Status: DC | PRN

## 2015-03-27 MED ORDER — SODIUM CHLORIDE 0.9 % IV BOLUS (SEPSIS)
1000.0000 mL | Freq: Once | INTRAVENOUS | Status: AC
  Administered 2015-03-27: 1000 mL via INTRAVENOUS

## 2015-03-27 MED ORDER — SODIUM CHLORIDE 0.9 % IJ SOLN
INTRAMUSCULAR | Status: AC
  Filled 2015-03-27: qty 1000

## 2015-03-27 NOTE — ED Notes (Signed)
Patient would like something for nausea. RN made aware.

## 2015-03-27 NOTE — ED Notes (Signed)
Pt made aware to return if symptoms worsen or if any life threatening symptoms occur.   

## 2015-03-27 NOTE — ED Notes (Signed)
Pt reports bright red blood in stool x3 days recognized as "mucus" with nausea and a few episodes of diarrhea. Pt reports fevers up to 104.0. 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 Pt alert and oriented.

## 2015-03-27 NOTE — ED Provider Notes (Signed)
CSN: [id number]     Arrival date & time 03/27/15  0909 History  This chart was scribed for Yolanda Greek, MD by Terressa Koyanagi, ED Scribe. This patient was seen in room APA02/APA02 and the patient's care was started at 9:30 AM.   Chief Complaint  Patient presents with  . GI Bleeding   The history is provided by the patient. No language interpreter was used.   PCP: No PCP Per Patient HPI Comments: Yolanda [name] is a 54 y.o. female, with PMHx noted below including cholecystectomy and appendectomy, who presents to the Emergency Department complaining of bright red blood in stool with associated diarrhea, nausea, lower abd pain, and intermittent fever (highest temperature of 104 degrees farenheit at home) onset 4 days ago. Pt reports taking tylenol at home for her fever with relief. Pt denies hysterectomy.   Past Medical History  Diagnosis Date  . Arthritis   . Fibromyalgia   . Allergy     shell fish  . Anxiety   . Thyroid disease     hypothryroid  . GERD (gastroesophageal reflux disease)   . Renal disorder     kidney stones  . Seizures     from Cymbalta   Past Surgical History  Procedure Laterality Date  . Cholecystectomy    . Tubal ligation     Family History  Problem Relation Age of Onset  . Prostate cancer Father   . Heart disease Father   . Colon cancer Paternal Aunt   . Colon cancer Paternal Aunt    Social History  Substance Use Topics  . Smoking status: Current Every Day Smoker -- 0.50 packs/day    Types: Cigarettes  . Smokeless tobacco: Never Used  . Alcohol Use: No   OB History    No data available     Review of Systems  Constitutional: Positive for fever.  Gastrointestinal: Positive for nausea, abdominal pain, diarrhea and blood in stool.  All other systems reviewed and are negative.  Allergies  Iodine; Shellfish allergy; and Nsaids  Home Medications   Prior to Admission medications   Medication Sig Start Date End Date Taking? Authorizing  Provider  acetaminophen (TYLENOL) 500 MG tablet Take 1,000 mg by mouth every 6 (six) hours as needed for mild pain.   Yes Historical Provider, MD  acetaminophen-codeine (TYLENOL #3) 300-30 MG per tablet Take 1-2 tablets by mouth every 6 (six) hours as needed. 03/02/14  Yes Lily Kocher, PA-C  CALCIUM PO Take 1 tablet by mouth daily.   Yes Historical Provider, MD  cholecalciferol (VITAMIN D) 1000 UNITS tablet Take 1,000 Units by mouth daily.   Yes Historical Provider, MD  Multiple Vitamins-Minerals (MULTI ADULT GUMMIES PO) Take 2 each by mouth daily.   Yes Historical Provider, MD  ondansetron (ZOFRAN) 4 MG tablet Take 1 tablet (4 mg total) by mouth every 8 (eight) hours as needed for nausea or vomiting. 06/25/14  Yes Noemi Chapel, MD  HYDROcodone-acetaminophen (NORCO/VICODIN) 5-325 MG per tablet Take 2 tablets by mouth every 4 (four) hours as needed. Patient not taking: Reported on 03/27/2015 06/25/14   Noemi Chapel, MD   Triage Vitals: BP 144/110 mmHg  Pulse 123  Temp(Src) 98.3 F (36.8 C) (Oral)  Resp 16  Ht 5\' 4"  (1.626 m)  Wt 134 lb (60.782 kg)  BMI 22.99 kg/m2  SpO2 98% Physical Exam  Constitutional: She is oriented to person, place, and time. She appears well-developed and well-nourished. No distress.  HENT:  Head: Normocephalic and atraumatic.  Right Ear: Hearing normal.  Left Ear: Hearing normal.  Nose: Nose normal.  Mouth/Throat: Oropharynx is clear and moist and mucous membranes are normal.  Eyes: Conjunctivae and EOM are normal. Pupils are equal, round, and reactive to light.  Neck: Normal range of motion. Neck supple.  Cardiovascular: Regular rhythm, S1 normal and S2 normal.  Tachycardia present.  Exam reveals no gallop and no friction rub.   No murmur heard. Pulmonary/Chest: Effort normal and breath sounds normal. No respiratory distress. She exhibits no tenderness.  Abdominal: Soft. Normal appearance and bowel sounds are normal. There is no hepatosplenomegaly. There is  tenderness (diffused right sided tenderness). There is no rebound, no guarding, no tenderness at McBurney's point and negative Murphy's sign. No hernia.  Musculoskeletal: Normal range of motion.  Neurological: She is alert and oriented to person, place, and time. She has normal strength. No cranial nerve deficit or sensory deficit. Coordination normal. GCS eye subscore is 4. GCS verbal subscore is 5. GCS motor subscore is 6.  Skin: Skin is warm, dry and intact. No rash noted. No cyanosis.  Psychiatric: She has a normal mood and affect. Her speech is normal and behavior is normal. Thought content normal.  Nursing note and vitals reviewed.   ED Course  Procedures (including critical care time) DIAGNOSTIC STUDIES: Oxygen Saturation is 98% on RA, nl by my interpretation.    COORDINATION OF CARE: 9:32 AM-Discussed treatment plan with pt at bedside and pt agreed to plan.   Labs Review Labs Reviewed  CBC WITH DIFFERENTIAL/PLATELET - Abnormal; Notable for the following:    WBC 20.7 (*)    Hemoglobin 15.6 (*)    Neutrophils Relative % 83 (*)    Neutro Abs 17.2 (*)    Lymphocytes Relative 9 (*)    Monocytes Absolute 1.6 (*)    All other components within normal limits  COMPREHENSIVE METABOLIC PANEL - Abnormal; Notable for the following:    Potassium 3.3 (*)    Glucose, Bld 126 (*)    Creatinine, Ser 1.04 (*)    GFR calc non Af Amer 59 (*)    All other components within normal limits  LIPASE, BLOOD - Abnormal; Notable for the following:    Lipase 15 (*)    All other components within normal limits  POC OCCULT BLOOD, ED - Abnormal; Notable for the following:    Fecal Occult Bld POSITIVE (*)    All other components within normal limits  PROTIME-INR  URINALYSIS, ROUTINE W REFLEX MICROSCOPIC (NOT AT [facility])  I-STAT CG4 LACTIC ACID, ED    Imaging Review Ct Abdomen Pelvis W Contrast  03/27/2015   CLINICAL DATA:  Patient with bright red blood in stool for 3 days. Nausea, fever and diarrhea.   EXAM: CT ABDOMEN AND PELVIS WITH CONTRAST  TECHNIQUE: Multidetector CT imaging of the abdomen and pelvis was performed using the standard protocol following bolus administration of intravenous contrast.  CONTRAST:  54mL OMNIPAQUE IOHEXOL 300 MG/ML SOLN, 188mL OMNIPAQUE IOHEXOL 300 MG/ML SOLN  COMPARISON:  CT 06/25/2014  FINDINGS: Lower chest: Normal heart size. No consolidative or nodular pulmonary opacities. No pleural effusion.  Hepatobiliary: Liver is normal in size and contour. Patient status post cholecystectomy. Mild central intrahepatic biliary ductal dilatation. The common bile duct is prominent measuring up to 12 mm. Stable sub cm low-attenuation lesion right hepatic lobe (image 21; series 4), too small to characterize.  Pancreas: Pancreatic divisum. Otherwise unremarkable pancreatic parenchyma.  Spleen: Unremarkable  Adrenals/Urinary Tract: Normal adrenal glands. Kidneys enhance  symmetrically with contrast. Unchanged sub cm low-attenuation lesion inferior pole right kidney. No hydronephrosis. Re- demonstrated 3 mm nonobstructing stone interpolar region right kidney. Urinary bladder is unremarkable.  Stomach/Bowel: There is circumferential wall thickening of the descending and sigmoid colon to the level of the rectum. This begins at the level of splenic flexure. Oral contrast material is demonstrated throughout the small bowel which is normal in appearance without evidence for obstruction. The stomach is morphologically normal. No free fluid or free intraperitoneal air.  Vascular/Lymphatic: Normal caliber abdominal aorta. No retroperitoneal lymphadenopathy.  Other: Uterus and adnexal structures are unremarkable.  Musculoskeletal: No aggressive or acute appearing osseous lesions.  IMPRESSION: There is circumferential wall thickening of the descending and sigmoid colon beginning at the level of the splenic flexure most compatible with colitis. Differential considerations include infectious, inflammatory  ischemic etiologies.  Prominent intrahepatic and extrahepatic bile ducts however likely normal given post cholecystectomy state. Consider correlation with LFTs.  Pancreatic divisum.  3 mm nonobstructing stone interpolar region right kidney.   Electronically Signed   By: Lovey Newcomer M.D.   On: 03/27/2015 11:50   I have personally reviewed and evaluated these images and lab results as part of my medical decision-making.   EKG Interpretation   Date/Time:  Wednesday March 27 2015 10:13:14 EDT Ventricular Rate:  106 PR Interval:  147 QRS Duration: 94 QT Interval:  447 QTC Calculation: 594 R Axis:   -2 Text Interpretation:  Sinus tachycardia Consider right atrial enlargement  Borderline T abnormalities, diffuse leads Prolonged QT interval Confirmed  by POLLINA  MD, CHRISTOPHER [phone]) on 03/27/2015 10:25:23 AM      MDM   Final diagnoses:  Rectal bleeding  Colitis    Patient resents to the ER for evaluation of rectal bleeding. Patient reports that she has been experiencing abdominal pain, cramping and rectal bleeding. Initially she was experiencing diarrhea, but this has turned to mucus that has blood mixed in it. She has been running a high fever at home, but is taking Tylenol for this. She is concerned because she has a history of polyps, is concerned she has colon cancer.  Workup reveals significant leukocytosis but no evidence of anemia. CT scan performed and does show evidence of colitis which explains the patient's current symptoms. With her history of fever, infectious colitis is the most likely cause. Patient will be treated with Cipro, Flagyl, analgesia. She will be referred to GI for follow-up. Return if her symptoms worsen.  I personally performed the services described in this documentation, which was scribed in my presence. The recorded information has been reviewed and is accurate.     Yolanda Greek, MD 03/27/15 [phone]

## 2015-03-27 NOTE — ED Notes (Signed)
Pt ambulated to bathroom 

## 2015-03-27 NOTE — ED Notes (Signed)
MD at bedside. 

## 2015-03-27 NOTE — Discharge Instructions (Signed)

## 2015-03-29 ENCOUNTER — Encounter: Payer: Self-pay | Admitting: Gastroenterology

## 2015-05-21 ENCOUNTER — Ambulatory Visit: Payer: Self-pay | Admitting: Gastroenterology

## 2015-05-24 ENCOUNTER — Ambulatory Visit (INDEPENDENT_AMBULATORY_CARE_PROVIDER_SITE_OTHER): Payer: Managed Care, Other (non HMO) | Admitting: Internal Medicine

## 2015-05-24 ENCOUNTER — Other Ambulatory Visit (INDEPENDENT_AMBULATORY_CARE_PROVIDER_SITE_OTHER): Payer: Managed Care, Other (non HMO)

## 2015-05-24 ENCOUNTER — Encounter: Payer: Self-pay | Admitting: Internal Medicine

## 2015-05-24 VITALS — BP 134/100 | HR 91 | Temp 97.9°F | Resp 18 | Ht 63.5 in | Wt 148.0 lb

## 2015-05-24 DIAGNOSIS — I10 Essential (primary) hypertension: Secondary | ICD-10-CM

## 2015-05-24 DIAGNOSIS — F411 Generalized anxiety disorder: Secondary | ICD-10-CM

## 2015-05-24 DIAGNOSIS — R739 Hyperglycemia, unspecified: Secondary | ICD-10-CM | POA: Diagnosis not present

## 2015-05-24 DIAGNOSIS — M48061 Spinal stenosis, lumbar region without neurogenic claudication: Secondary | ICD-10-CM

## 2015-05-24 DIAGNOSIS — F32A Depression, unspecified: Secondary | ICD-10-CM | POA: Insufficient documentation

## 2015-05-24 DIAGNOSIS — M4806 Spinal stenosis, lumbar region: Secondary | ICD-10-CM | POA: Diagnosis not present

## 2015-05-24 DIAGNOSIS — Z23 Encounter for immunization: Secondary | ICD-10-CM

## 2015-05-24 DIAGNOSIS — E039 Hypothyroidism, unspecified: Secondary | ICD-10-CM

## 2015-05-24 DIAGNOSIS — F329 Major depressive disorder, single episode, unspecified: Secondary | ICD-10-CM | POA: Insufficient documentation

## 2015-05-24 DIAGNOSIS — G4700 Insomnia, unspecified: Secondary | ICD-10-CM

## 2015-05-24 DIAGNOSIS — K529 Noninfective gastroenteritis and colitis, unspecified: Secondary | ICD-10-CM | POA: Diagnosis not present

## 2015-05-24 DIAGNOSIS — M2550 Pain in unspecified joint: Secondary | ICD-10-CM

## 2015-05-24 DIAGNOSIS — M797 Fibromyalgia: Secondary | ICD-10-CM

## 2015-05-24 LAB — COMPREHENSIVE METABOLIC PANEL
ALBUMIN: 4.3 g/dL (ref 3.5–5.2)
ALT: 12 U/L (ref 0–35)
AST: 14 U/L (ref 0–37)
Alkaline Phosphatase: 70 U/L (ref 39–117)
BUN: 8 mg/dL (ref 6–23)
CALCIUM: 10.1 mg/dL (ref 8.4–10.5)
CHLORIDE: 107 meq/L (ref 96–112)
CO2: 27 mEq/L (ref 19–32)
Creatinine, Ser: 0.99 mg/dL (ref 0.40–1.20)
GFR: 61.85 mL/min (ref >60.00)
Glucose, Bld: 93 mg/dL (ref 70–99)
POTASSIUM: 4.2 meq/L (ref 3.5–5.1)
SODIUM: 142 meq/L (ref 135–145)
Total Bilirubin: 0.3 mg/dL (ref 0.2–1.2)
Total Protein: 7.4 g/dL (ref 6.0–8.3)

## 2015-05-24 LAB — RHEUMATOID FACTOR

## 2015-05-24 LAB — HEMOGLOBIN A1C: HEMOGLOBIN A1C: 5.3 % (ref 4.6–6.5)

## 2015-05-24 LAB — CBC WITH DIFFERENTIAL/PLATELET
BASOS PCT: 0.5 % (ref 0.0–3.0)
Basophils Absolute: 0 10*3/uL (ref 0.0–0.1)
EOS PCT: 1.9 % (ref 0.0–5.0)
Eosinophils Absolute: 0.2 10*3/uL (ref 0.0–0.7)
HEMATOCRIT: 42.1 % (ref 36.0–46.0)
HEMOGLOBIN: 14.2 g/dL (ref 12.0–15.0)
Lymphocytes Relative: 38.1 % (ref 12.0–46.0)
Lymphs Abs: 3.1 10*3/uL (ref 0.7–4.0)
MCHC: 33.7 g/dL (ref 30.0–36.0)
MCV: 95.5 fl (ref 78.0–100.0)
MONO ABS: 0.5 10*3/uL (ref 0.1–1.0)
Monocytes Relative: 5.8 % (ref 3.0–12.0)
NEUTROS ABS: 4.4 10*3/uL (ref 1.4–7.7)
Neutrophils Relative %: 53.7 % (ref 43.0–77.0)
PLATELETS: 238 10*3/uL (ref 150.0–400.0)
RBC: 4.4 Mil/uL (ref 3.87–5.11)
RDW: 14.1 % (ref 11.5–15.5)
WBC: 8.2 10*3/uL (ref 4.0–10.5)

## 2015-05-24 LAB — T3, FREE: T3, Free: 3.3 pg/mL (ref 2.3–4.2)

## 2015-05-24 LAB — T4, FREE: Free T4: 0.88 ng/dL (ref 0.60–1.60)

## 2015-05-24 LAB — TSH: TSH: 3.81 u[IU]/mL (ref 0.35–4.50)

## 2015-05-24 MED ORDER — AMLODIPINE BESYLATE 5 MG PO TABS: 5.0000 mg | ORAL_TABLET | Freq: Every day | ORAL | Status: DC

## 2015-05-24 MED ORDER — AMITRIPTYLINE HCL 10 MG PO TABS: 10.0000 mg | ORAL_TABLET | Freq: Every day | ORAL | Status: DC

## 2015-05-24 MED ORDER — HYDROCODONE-ACETAMINOPHEN 5-325 MG PO TABS: 1.0000 | ORAL_TABLET | ORAL | Status: DC | PRN

## 2015-05-24 NOTE — Assessment & Plan Note (Signed)
Will try elavil Consider psychology referral to help deal with depression and anxiety and chronic pain which is likely contributing to her sleep difficulties

## 2015-05-24 NOTE — Assessment & Plan Note (Signed)
Multiple episodes - likely inflammatory Has an appointment with GI this month

## 2015-05-24 NOTE — Progress Notes (Signed)
Pre visit review using our clinic review tool, if applicable. No additional management support is needed unless otherwise documented below in the visit note. 

## 2015-05-24 NOTE — Assessment & Plan Note (Signed)
?   OA or autoimmune in nature Will check ANA and RF

## 2015-05-24 NOTE — Patient Instructions (Addendum)
  We have reviewed your prior records including labs and tests today.  Test(s) ordered today. Your results will be released to [facility] (or called to you) after review, usually within 72hours after test completion. If any changes need to be made, you will be notified at that same time.  Flu vaccine administered today.   Medications reviewed and updated.  Changes include starting amlodipine for your blood pressure.  We will also start amitriptyline at night for the fibromyalgia.  Your prescription(s) have been submitted to your pharmacy. Please take as directed and contact our office if you believe you are having problem(s) with the medication(s).  Please schedule followup in 4 weeks

## 2015-05-24 NOTE — Assessment & Plan Note (Signed)
She monitors her bp at home and it has been consistently elevated Discussed possible consequences of uncontrolled htn Start amlodipine 5 mg daily, discussed possible side effects from medication Check blood work Follow up in one month

## 2015-05-24 NOTE — Assessment & Plan Note (Signed)
Will try elavil for depression, anxiety and sleep as well as fibromyalgia Will titrate up dose - if not effective will try a SSRI ? lexapro if she has not tried it in the past

## 2015-05-24 NOTE — Assessment & Plan Note (Signed)
History of hyperglycemia - she also checks at home and has had fasting sugars over 100 Check a1c

## 2015-05-24 NOTE — Progress Notes (Signed)
Subjective:    Patient ID: Yolanda [name], female    DOB: Jul 24, 1960, 54 y.o.   MRN: [id number]  HPI She is here to establish with a new pcp. She has concerns about her fibromyalgia pain.  Colitis: She has had it four times - has been told she has crohn's, but doctors in the ED.  She had a 2014 in colonoscopy - cancerous polyp was removed.  With her episodes of colitis she gets abdominal pain, bloating, diarrhea, mucus and blood in her stool. She also gets a fever.  She was last in the hospital in September 2016.  She has had some minor symptoms since the hospitalization, but she has been able to control the sytmpoms.  She has to avoid certain foods.    RUQ pain:  She has intermittent pain near the scar from her cholecystectomy.  She typically gets the pain when she bends forward - if she leans back and pushes in the area it helps.  She denies bulges, but wonders about a hernia.     Kidney disease:  In the hospital paperwork it always states she has chronic kidney disease so she made an appointment with a kidney specialist. She does have a history of kidney stones.   She typically passes the stones at home.  She currently denies hematuria.  Fibromyalgia:  She started having pain all over her body years ago.  She has been on multiple medications and had more side effects from the meds and stopped all of them.  She tried multiple natural remedies and nothing helped.  Her skin feels like it is turned inside out at times and it very sensitive.  Sometimes touching her skin hurts.  She took gabapentin and cymbalta and did not help and she thinks the cymbalta made her worse.  She is afraid of the side effects of lyrica and does not want to try it.  She tried trazodone and it did not help her sleep.  She was on fentanyl and she had adie effects.  She weaned herself off.  She was also on percocet and morphine at one point.  She tried [place], valium, clonazepam and none helped sleep.  She has been on flexeril  and she thinks it helped relax her muscles a little.  She would prefer to be on no medication, but she has no quality of life and needs something.  She was recently prescribed hydrocodone when she went to the hospital for colitis and she has taken 1 intermittently and it helps with the pain.    Anxiety, poor sleep:  She feels overwhelmed and anxious.  She feels depressed at times because of her pain and limitations physically.  She was on cymbalta and it did not help her - she felt worse on it.  She is unsure if she tried any other anti-depressants, but may have tried lexapro.   Medications and allergies reviewed with patient and updated if appropriate.  There are no active problems to display for this patient.   Past Medical History  Diagnosis Date  . Arthritis   . Fibromyalgia   . Allergy     shell fish  . Anxiety   . Thyroid disease     hypothryroid  . GERD (gastroesophageal reflux disease)   . Renal disorder     kidney stones  . Seizures ([place])     from Cymbalta    Past Surgical History  Procedure Laterality Date  . Cholecystectomy    . Tubal  ligation      Social History   Social History  . Marital Status: Married    Spouse Name: N/A  . Number of Children: N/A  . Years of Education: N/A   Social History Main Topics  . Smoking status: Current Every Day Smoker -- 0.50 packs/day    Types: Cigarettes  . Smokeless tobacco: Never Used  . Alcohol Use: No  . Drug Use: No  . Sexual Activity: Yes   Other Topics Concern  . None   Social History Narrative    Review of Systems  Constitutional: Positive for appetite change and fatigue. Negative for fever and chills.  Eyes: Negative for visual disturbance.  Respiratory: Positive for cough (from smoking). Negative for shortness of breath and wheezing.   Cardiovascular: Positive for palpitations. Negative for chest pain and leg swelling.  Gastrointestinal: Positive for nausea. Negative for abdominal pain, diarrhea,  constipation and blood in stool.       No gerd  Genitourinary: Negative for dysuria and hematuria.  Musculoskeletal: Positive for myalgias and arthralgias.  Skin: Negative for rash.       Skin hypersensitive  Neurological: Positive for headaches (occasional). Negative for dizziness and light-headedness.  Psychiatric/Behavioral: Positive for sleep disturbance and dysphoric mood. The patient is nervous/anxious.        Objective:   Filed Vitals:   05/24/15 0911  BP: 134/100  Pulse: 91  Temp: 97.9 F (36.6 C)  Resp: 18   Filed Weights   05/24/15 0911  Weight: 148 lb (67.132 kg)   Body mass index is 25.8 kg/(m^2).   Physical Exam  Constitutional: She is oriented to person, place, and time. She appears well-developed and well-nourished. No distress.  HENT:  Head: Normocephalic and atraumatic.  Right Ear: External ear normal.  Left Ear: External ear normal.  Mouth/Throat: Oropharynx is clear and moist.  Eyes: Conjunctivae and EOM are normal.  Neck: Neck supple. No tracheal deviation present. No thyromegaly present.  No carotid bruit  Cardiovascular: Normal rate, regular rhythm and normal heart sounds.   No murmur heard. Pulmonary/Chest: Effort normal and breath sounds normal. No respiratory distress. She has no wheezes. She has no rales.  Abdominal: Soft. She exhibits no distension. There is no tenderness.  Musculoskeletal: She exhibits tenderness (all muscles tender to touch). She exhibits no edema.  No joint swelling or deformities  Lymphadenopathy:    She has no cervical adenopathy.  Neurological: She is alert and oriented to person, place, and time.  Skin: Skin is warm and dry. No rash noted.  Psychiatric: She has a normal mood and affect. Her behavior is normal.       Assessment & Plan:   See Problem List.   Follow up in 4 weeks, sooner if needed

## 2015-05-24 NOTE — Assessment & Plan Note (Signed)
Pain, hypersensitivity of skin, fatigue, anxiety, sleep issues Has tried multiple medications and many natural things Decreased quality of life Discussed options - will try elavil 10 mg at night - increase to 20 mg in one week if tolerates the medication Can consider savella; flexeril Consider rheumatology referral

## 2015-05-24 NOTE — Assessment & Plan Note (Signed)
History of hypothyroidism - was on medication, but then apparently was able to come off of the medication and thyroid function was in the normal range Will check tfts and thyroid antibodies Will restart medication if needed

## 2015-05-25 ENCOUNTER — Encounter: Payer: Self-pay | Admitting: Internal Medicine

## 2015-05-25 LAB — THYROID ANTIBODIES
Thyroglobulin Ab: <1 IU/mL (ref <2)
Thyroperoxidase Ab SerPl-aCnc: 1 IU/mL (ref <9)

## 2015-05-27 LAB — ANA: Anti Nuclear Antibody(ANA): NEGATIVE

## 2015-05-28 ENCOUNTER — Telehealth: Payer: Self-pay | Admitting: Internal Medicine

## 2015-05-28 NOTE — Telephone Encounter (Signed)
Patient called to follow up on mychart message sent on 05/25/2015. She states that the amitriptyline (ELAVIL) 10 MG tablet [[id number]] is causing adverse affects. She states that it is doing the opposite of what it is supposed to do.

## 2015-05-29 MED ORDER — CLONAZEPAM 0.5 MG PO TABS: 0.5000 mg | ORAL_TABLET | Freq: Two times a day (BID) | ORAL | Status: DC | PRN

## 2015-05-29 MED ORDER — AMLODIPINE BESYLATE 5 MG PO TABS: 10.0000 mg | ORAL_TABLET | Freq: Every day | ORAL | Status: DC

## 2015-05-29 NOTE — Telephone Encounter (Signed)
For anxiety we can try clonazepam twice daily.  See below

## 2015-05-29 NOTE — Telephone Encounter (Signed)
Clonazepam 0.5 mg twice daily - printed.  Try increasing amlodipine to 10 mg daily -- can take two pills once daily.  Will send new script to pharmacy.

## 2015-05-29 NOTE — Telephone Encounter (Signed)
She should stop the medication.  We can go back to flexeril (muscle relaxer ) at night.  We can also consider trying savella -- she can look it up and see if she wants to try it. Let me know.  Find out exact symptoms of the elavil so I can update her chart.

## 2015-05-29 NOTE — Telephone Encounter (Signed)
Pt is okay with stopping Elavil and starting Clonazepam. Pt stated that After taking BP medication in the morning her BP is avg 130/110, 1130-230 145/110. Highest it has been is 160/120. She would like to know if this should be increased? Please advise on BP med and dosage for Clonazepam.

## 2015-05-29 NOTE — Telephone Encounter (Signed)
Please advise 

## 2015-05-30 ENCOUNTER — Encounter: Payer: Self-pay | Admitting: Internal Medicine

## 2015-05-30 NOTE — Telephone Encounter (Signed)
Spoke with pt, she stated the Clonazepam has helped drastically. She started the new dose of Amlodipine this morning and stated 32mins after taking meds her BP was 118/100. Please advise if any other changes should be made. I instructed her to keep monitoring the BP

## 2015-05-30 NOTE — Telephone Encounter (Signed)
See phone note

## 2015-05-30 NOTE — Telephone Encounter (Signed)
No additional change at this time -- lets give the increased amlodipine dose a few days - couple of weeks to see how effective it is

## 2015-05-30 NOTE — Telephone Encounter (Signed)
Spoke with pt to inform.  

## 2015-05-31 ENCOUNTER — Other Ambulatory Visit (INDEPENDENT_AMBULATORY_CARE_PROVIDER_SITE_OTHER): Payer: Managed Care, Other (non HMO)

## 2015-05-31 ENCOUNTER — Ambulatory Visit: Payer: Self-pay | Admitting: Gastroenterology

## 2015-05-31 ENCOUNTER — Encounter: Payer: Self-pay | Admitting: Gastroenterology

## 2015-05-31 ENCOUNTER — Ambulatory Visit (INDEPENDENT_AMBULATORY_CARE_PROVIDER_SITE_OTHER): Payer: Managed Care, Other (non HMO) | Admitting: Gastroenterology

## 2015-05-31 VITALS — BP 98/78 | HR 100 | Ht 62.5 in | Wt 145.2 lb

## 2015-05-31 DIAGNOSIS — R197 Diarrhea, unspecified: Secondary | ICD-10-CM

## 2015-05-31 DIAGNOSIS — K625 Hemorrhage of anus and rectum: Secondary | ICD-10-CM

## 2015-05-31 DIAGNOSIS — R1084 Generalized abdominal pain: Secondary | ICD-10-CM

## 2015-05-31 LAB — IGA: IgA: 190 mg/dL (ref 68–378)

## 2015-05-31 MED ORDER — DICYCLOMINE HCL 10 MG PO CAPS: 10.0000 mg | ORAL_CAPSULE | Freq: Three times a day (TID) | ORAL | Status: DC

## 2015-05-31 MED ORDER — NA SULFATE-K SULFATE-MG SULF 17.5-3.13-1.6 GM/177ML PO SOLN: 1.0000 | Freq: Once | ORAL | Status: DC

## 2015-05-31 NOTE — Patient Instructions (Signed)
You have been scheduled for a colonoscopy. Please follow written instructions given to you at your visit today.  Please pick up your prep supplies at the pharmacy within the next 1-3 days. If you use inhalers (even only as needed), please bring them with you on the day of your procedure. Your physician has requested that you go to www.[email or web address].com and enter the access code given to you at your visit today. This web site gives a general overview about your procedure. However, you should still follow specific instructions given to you by our office regarding your preparation for the procedure.  You have been scheduled for an endoscopy. Please follow written instructions given to you at your visit today. If you use inhalers (even only as needed), please bring them with you on the day of your procedure. Your physician has requested that you go to www.[email or web address].com and enter the access code given to you at your visit today. This web site gives a general overview about your procedure. However, you should still follow specific instructions given to you by our office regarding your preparation for the procedure.  Go to the basement for labs today We will send Dicyclomine to your pharmacy  You will follow up after your procedures

## 2015-05-31 NOTE — Progress Notes (Signed)
Yolanda [name]    OI:[id number]    September 28, 1960  Primary Care Physician:Stacy Lorretta Harp, MD  Referring Physician: Binnie Rail, MD [place], [place] [address]  Chief complaint:  Diarrhea  HPI:  54 year old female here for new patient visit with complaints of diarrhea since September 2016, and she had an episode of bright red blood per rectum in September for which she had an ER visit. Her labs were unremarkable at the time and she was discharged home to follow-up with [place]. She also had a CT abdomen and pelvis which showed thickening of the left side colon in the sigmoid and descending concerning for colitis. Her last colonoscopy was in 2014 1 sessile polyp removed tubular adenoma. She gives history of gastric ulcers diagnosed on endoscopy about 15-20 years ago. She has intermittent heartburn controlled with life style changes and diet ,currently not on any acid suppression medication. Denies constipation, nausea or vomiting. Complaints of epigastric pain and also right and left lower quadrant pain. She is requesting pain medication. She also get a hernia at the site of surgical scar in the right upper quadrant which is reducible.   Outpatient Encounter Prescriptions as of 05/31/2015  Medication Sig  . amLODipine (NORVASC) 5 MG tablet Take 2 tablets (10 mg total) by mouth daily.  Marland Kitchen CALCIUM PO Take 1 tablet by mouth daily.  . cholecalciferol (VITAMIN D) 1000 UNITS tablet Take 1,000 Units by mouth daily.  . clonazePAM (KLONOPIN) 0.5 MG tablet Take 1 tablet (0.5 mg total) by mouth 2 (two) times daily as needed for anxiety.  . Multiple Vitamins-Minerals (MULTI ADULT GUMMIES PO) Take 2 each by mouth daily.  . ondansetron (ZOFRAN) 4 MG tablet Take 1 tablet (4 mg total) by mouth every 8 (eight) hours as needed for nausea or vomiting.  . [DISCONTINUED] HYDROcodone-acetaminophen (NORCO/VICODIN) 5-325 MG tablet Take 1-2 tablets by mouth every 4 (four) hours as needed for moderate pain.    No facility-administered encounter medications on file as of 05/31/2015.    Allergies as of 05/31/2015 - Review Complete 05/31/2015  Allergen Reaction Noted  . Iodine Anaphylaxis and Swelling 11/25/2012  . Shellfish allergy Anaphylaxis and Swelling 10/08/2013  . Nsaids  03/02/2014    Past Medical History  Diagnosis Date  . Arthritis   . Fibromyalgia   . Allergy     shell fish  . Anxiety   . Thyroid disease     hypothryroid  . GERD (gastroesophageal reflux disease)   . Renal disorder     kidney stones  . Seizures ([place])     from Cymbalta  . HTN (hypertension)   . Abdominal hernia   . Hx of adenomatous colonic polyps   . Gallstones     Past Surgical History  Procedure Laterality Date  . Cholecystectomy    . Tubal ligation      Family History  Problem Relation Age of Onset  . Prostate cancer Father   . Heart disease Father   . Colon cancer Paternal Aunt   . Colon cancer Paternal Aunt   . Crohn's disease Paternal Aunt   . Fibromyalgia Paternal Aunt     Social History   Social History  . Marital Status: Married    Spouse Name: N/A  . Number of Children: 0  . Years of Education: N/A   Occupational History  . Not on file.   Social History Main Topics  . Smoking status: Current  Every Day Smoker -- 0.50 packs/day    Types: Cigarettes  . Smokeless tobacco: Never Used  . Alcohol Use: No  . Drug Use: No  . Sexual Activity: Yes   Other Topics Concern  . Not on file   Social History Narrative      Review of systems: Review of Systems  Constitutional: Negative for fever and chills.  HENT: Negative.   Eyes: Negative for blurred vision.  Respiratory: Negative for cough, shortness of breath and wheezing.   Cardiovascular: Negative for chest pain and palpitations.  Gastrointestinal: as per HPI Genitourinary: Negative for dysuria, urgency, frequency and hematuria.  Musculoskeletal: Negative for myalgias, back pain and joint pain.  Skin: Negative for  itching and rash.  Neurological: Negative for dizziness, tremors, focal weakness, seizures and loss of consciousness.  Endo/Heme/Allergies: Negative for environmental allergies.  Psychiatric/Behavioral: Negative for depression, suicidal ideas and hallucinations.  All other systems reviewed and are negative.   Physical Exam: Filed Vitals:   05/31/15 0852  BP: 98/78  Pulse: 100   Gen:      No acute distress HEENT:  EOMI, sclera anicteric Neck:     No masses; no thyromegaly Lungs:    Clear to auscultation bilaterally; normal respiratory effort CV:         Regular rate and rhythm; no murmurs Abd:      + bowel sounds; soft, non-tender; no palpable masses, no distension Ext:    No edema; adequate peripheral perfusion Skin:      Warm and dry; no rash Neuro: alert and oriented x 3 Psych: normal mood and affect  Data Reviewed: CT abd & pelvis 03/2015 There is circumferential wall thickening of the descending and sigmoid colon beginning at the level of the splenic flexure most compatible with colitis. Differential considerations include infectious, inflammatory ischemic etiologies.  Prominent intrahepatic and extrahepatic bile ducts however likely normal given post cholecystectomy state. Consider correlation with LFTs.  Pancreatic divisum.  3 mm nonobstructing stone interpolar region right kidney.   Assessment and Plan/Recommendations: 54 year old female here for evaluation of chronic diarrhea associated with some blood per rectum, CT abdomen and pelvis in September was suggestive of left-sided colitis. She also has history of gastric ulcers. We'll schedule for EGD and colonoscopy with biopsies for evaluation Check TTG, IgA level to rule out celiac disease Dicyclomine 10 mg 3 times a day as needed Discussed with patient that I would not recommend chronic narcotics for management of abdominal pain and we will work towards diagnosing underlying etiology and treating it  appropriately Return for office visit after the procedure  K. Denzil Magnuson , MD [phone] Mon-Fri 8a-5p [phone] after 5p, weekends, holidays

## 2015-06-03 LAB — TISSUE TRANSGLUTAMINASE, IGA: Tissue Transglutaminase Ab, IgA: 1 U/mL (ref <4)

## 2015-06-04 ENCOUNTER — Ambulatory Visit (INDEPENDENT_AMBULATORY_CARE_PROVIDER_SITE_OTHER): Payer: Managed Care, Other (non HMO) | Admitting: Urology

## 2015-06-04 DIAGNOSIS — M545 Low back pain: Secondary | ICD-10-CM | POA: Diagnosis not present

## 2015-06-04 DIAGNOSIS — N200 Calculus of kidney: Secondary | ICD-10-CM

## 2015-06-06 MED ORDER — PREGABALIN 25 MG PO CAPS: 25.0000 mg | ORAL_CAPSULE | Freq: Two times a day (BID) | ORAL | Status: DC

## 2015-06-18 MED ORDER — PREGABALIN 50 MG PO CAPS: 50.0000 mg | ORAL_CAPSULE | Freq: Two times a day (BID) | ORAL | Status: DC

## 2015-06-18 NOTE — Addendum Note (Signed)
Addended by: Binnie Rail on: 06/18/2015 07:40 AM   Modules accepted: Orders

## 2015-06-21 ENCOUNTER — Ambulatory Visit (INDEPENDENT_AMBULATORY_CARE_PROVIDER_SITE_OTHER): Payer: Managed Care, Other (non HMO) | Admitting: Internal Medicine

## 2015-06-21 ENCOUNTER — Encounter: Payer: Self-pay | Admitting: Internal Medicine

## 2015-06-21 VITALS — BP 136/86 | HR 99 | Temp 98.3°F | Resp 20 | Ht 63.0 in | Wt 152.0 lb

## 2015-06-21 DIAGNOSIS — F411 Generalized anxiety disorder: Secondary | ICD-10-CM | POA: Diagnosis not present

## 2015-06-21 DIAGNOSIS — G4700 Insomnia, unspecified: Secondary | ICD-10-CM | POA: Diagnosis not present

## 2015-06-21 DIAGNOSIS — M797 Fibromyalgia: Secondary | ICD-10-CM | POA: Diagnosis not present

## 2015-06-21 DIAGNOSIS — Z720 Tobacco use: Secondary | ICD-10-CM

## 2015-06-21 DIAGNOSIS — I10 Essential (primary) hypertension: Secondary | ICD-10-CM

## 2015-06-21 DIAGNOSIS — F329 Major depressive disorder, single episode, unspecified: Secondary | ICD-10-CM | POA: Diagnosis not present

## 2015-06-21 DIAGNOSIS — F32A Depression, unspecified: Secondary | ICD-10-CM

## 2015-06-21 DIAGNOSIS — R002 Palpitations: Secondary | ICD-10-CM

## 2015-06-21 DIAGNOSIS — F17200 Nicotine dependence, unspecified, uncomplicated: Secondary | ICD-10-CM

## 2015-06-21 DIAGNOSIS — R079 Chest pain, unspecified: Secondary | ICD-10-CM

## 2015-06-21 MED ORDER — AMLODIPINE BESYLATE 10 MG PO TABS: 10.0000 mg | ORAL_TABLET | Freq: Every day | ORAL | Status: DC

## 2015-06-21 MED ORDER — PREGABALIN 100 MG PO CAPS: 100.0000 mg | ORAL_CAPSULE | Freq: Two times a day (BID) | ORAL | Status: DC

## 2015-06-21 MED ORDER — AMLODIPINE BESYLATE 5 MG PO TABS: 5.0000 mg | ORAL_TABLET | Freq: Every day | ORAL | Status: DC

## 2015-06-21 MED ORDER — HYDROCHLOROTHIAZIDE 12.5 MG PO TABS: 12.5000 mg | ORAL_TABLET | Freq: Every day | ORAL | Status: DC

## 2015-06-21 MED ORDER — BUPROPION HCL ER (XL) 150 MG PO TB24: 150.0000 mg | ORAL_TABLET | Freq: Every day | ORAL | Status: DC

## 2015-06-21 MED ORDER — CLONAZEPAM 0.5 MG PO TABS: 0.5000 mg | ORAL_TABLET | Freq: Three times a day (TID) | ORAL | Status: DC | PRN

## 2015-06-21 NOTE — Progress Notes (Signed)
Subjective:    Patient ID: Yolanda [name], female    DOB: 23-Aug-1960, 54 y.o.   MRN: [id number]  HPI She is here for follow-up of her fibromyalgia and anxiety.  Fibromyalgia: She is taking the Lyrica daily as prescribed. In the past she took Cymbalta, gabapentin but they made her symptoms worse. We increased the dose of the Lyrica from 25 mg twice daily to 50 mg twice daily.  She has seen a decrease in the skin hypersensitivity.  Her skin does not feel turned inside out as much.  Her heat flashes have decreased.  Overall, she feels the medicaion has helped and she thinks she could go on a higher dose.    Anxiety, poor sleep: At her last visit she was feeling overwhelmed and anxious. She was also depressed at times because of her medical problems. She was on Cymbalta, but it did not help. She has been on Ambien, Valium and none of them have helped with her sleep.  She is taking the clonazepam twice daily.   She feels the medication has improved her anxiety and she is tolerating it well.   Hypertension: She is taking her medication daily. She is compliant with a low sodium diet.  She does have intermittent chest pain.  It can occur every othe rday and she will have associated left shoulder an delbow pain, not whole arm pain.  She takes a ASA 81 mg and it relieves the pain.  She can feel her heart change rhythms.  She denies edema, shortness of breath and regular headaches. She is not exercising regularly, but is starting to feel better and is trying to increase her activity.  She does monitor her blood pressure at home and it has been 103/79 113, 104/84 110, 104/89 90,  109/88  105,  105/78 98,  109/83  93.    Her HR is always on the high side.   Smoking:  She wants to quit.  She has tried chantix but had side effects. She would prefer not to take a nicotine replacement.  She has never tried wellbutrin.     Medications and allergies reviewed with patient and updated.  Patient Active Problem List     Diagnosis Date Noted  . Fibromyalgia 05/24/2015  . Spinal stenosis of lumbar region 05/24/2015  . Colitis 05/24/2015  . Joint pain 05/24/2015  . Hyperglycemia 05/24/2015  . Essential hypertension, benign 05/24/2015  . Thyroid activity decreased 05/24/2015  . Insomnia 05/24/2015  . Generalized anxiety disorder 05/24/2015  . Depression 05/24/2015    Current Outpatient Prescriptions on File Prior to Visit  Medication Sig Dispense Refill  . amLODipine (NORVASC) 5 MG tablet Take 2 tablets (10 mg total) by mouth daily. 60 tablet 5  . CALCIUM PO Take 1 tablet by mouth daily.    . cholecalciferol (VITAMIN D) 1000 UNITS tablet Take 1,000 Units by mouth daily.    . clonazePAM (KLONOPIN) 0.5 MG tablet Take 1 tablet (0.5 mg total) by mouth 2 (two) times daily as needed for anxiety. 60 tablet 0  . dicyclomine (BENTYL) 10 MG capsule Take 1 capsule (10 mg total) by mouth 3 (three) times daily before meals. 90 capsule 3  . Multiple Vitamins-Minerals (MULTI ADULT GUMMIES PO) Take 2 each by mouth daily.    . Na Sulfate-K Sulfate-Mg Sulf (SUPREP BOWEL PREP) SOLN Take 1 kit by mouth once. 1 Bottle 0  . pregabalin (LYRICA) 50 MG capsule Take 1 capsule (50 mg total) by mouth 2 (  two) times daily. 60 capsule 3   No current facility-administered medications on file prior to visit.    Past Medical History  Diagnosis Date  . Arthritis   . Fibromyalgia   . Allergy     shell fish  . Anxiety   . Thyroid disease     hypothryroid  . GERD (gastroesophageal reflux disease)   . Renal disorder     kidney stones  . Seizures ([place])     from Cymbalta  . HTN (hypertension)   . Abdominal hernia   . Hx of adenomatous colonic polyps   . Gallstones     Past Surgical History  Procedure Laterality Date  . Cholecystectomy    . Tubal ligation      Social History   Social History  . Marital Status: Married    Spouse Name: N/A  . Number of Children: 0  . Years of Education: N/A   Social History Main  Topics  . Smoking status: Current Every Day Smoker -- 0.50 packs/day    Types: Cigarettes  . Smokeless tobacco: Never Used  . Alcohol Use: No  . Drug Use: No  . Sexual Activity: Yes   Other Topics Concern  . None   Social History Narrative    Review of Systems  Constitutional: Negative for fever and chills.  Respiratory: Positive for cough (from smoking). Negative for shortness of breath and wheezing.   Cardiovascular: Positive for chest pain (every other day, relieved w/ asa 81 mg), palpitations and leg swelling.  Musculoskeletal: Positive for myalgias.  Neurological: Positive for headaches. Negative for dizziness and light-headedness.       Objective:   Filed Vitals:   06/21/15 1402  BP: 136/86  Pulse: 99  Temp: 98.3 F (36.8 C)  Resp: 20   Filed Weights   06/21/15 1402  Weight: 152 lb (68.947 kg)   Body mass index is 26.93 kg/(m^2).   Physical Exam Constitutional: Appears well-developed and well-nourished. No distress.  Neck: Neck supple. No tracheal deviation present. No thyromegaly present.  No carotid bruit. No cervical adenopathy.   Cardiovascular: Normal rate, regular rhythm and normal heart sounds.   No murmur heard. Pulmonary/Chest: Effort normal and breath sounds normal. No respiratory distress. No wheezes.  Musculoskeletal: trace edema.       Assessment & Plan:   See Problem List.    Follow up in 3 months

## 2015-06-21 NOTE — Patient Instructions (Addendum)
  Have blood work done 1-2 weeks after taking the new medications.    Test(s) ordered today. Your results will be released to [place] (or called to you) after review, usually within 72hours after test completion. If any changes need to be made, you will be notified at that same time.  Medications reviewed and updated.  Changes include decreasing the amlodipine to 5 mg daily and starting hydrochlorothiazide 12.5 mg daily.  We will start wellbutrin to help you quit smoking.  Your prescription(s) have been submitted to your pharmacy. Please take as directed and contact our office if you believe you are having problem(s) with the medication(s).  A referral was ordered for cardiology  Please schedule followup in 3 months

## 2015-06-21 NOTE — Progress Notes (Signed)
Pre visit review using our clinic review tool, if applicable. No additional management support is needed unless otherwise documented below in the visit note. 

## 2015-06-23 DIAGNOSIS — F17200 Nicotine dependence, unspecified, uncomplicated: Secondary | ICD-10-CM | POA: Insufficient documentation

## 2015-06-23 NOTE — Assessment & Plan Note (Addendum)
Symptoms improved with lyrica and she is tolerating the medication Increase Lyrica to 100mg  twice daily Increase exercise Follow up in 3 months

## 2015-06-23 NOTE — Assessment & Plan Note (Signed)
Improved since fibromyalgia has improved Has not tolerated cymbalta Will start wellbutrin, but that may also help depression/anxiety

## 2015-06-23 NOTE — Assessment & Plan Note (Signed)
BP Readings from Last 3 Encounters:  06/21/15 136/86  05/31/15 98/78  05/24/15 134/100   BP borderline high here, but looks controlled at home She has had increased edema with either the lyrica or increased dose of norvasc Decrease norvasc back to 5 mg Start hctz 12.5mg  - may need to increase if the edema is from the lyrica and we are increasing the dose of the lyrica -- will monitor edema Continue to monitor BP at home Follow upin 3 months  hctz 12.5 daily, norvasc 5mg 

## 2015-06-23 NOTE — Assessment & Plan Note (Signed)
Improved with clonazepam  - continue third dose at bedtime Increased clonazepam to TID

## 2015-06-23 NOTE — Assessment & Plan Note (Signed)
Wants to quit Has not tolerated chantix and is not interested in nicotine replacement Try wellbutrin  Discussed the need to change habits as well

## 2015-06-23 NOTE — Assessment & Plan Note (Signed)
clonazrpam is helping anxiety and with her sleep, but does not last long Will increase clonazepam to three times daily - continue current dose of 0.5 mg

## 2015-07-03 ENCOUNTER — Encounter: Payer: Self-pay | Admitting: Internal Medicine

## 2015-07-04 MED ORDER — PREGABALIN 75 MG PO CAPS: 75.0000 mg | ORAL_CAPSULE | Freq: Two times a day (BID) | ORAL | Status: DC

## 2015-07-07 MED ORDER — QUETIAPINE FUMARATE 100 MG PO TABS: 100.0000 mg | ORAL_TABLET | Freq: Every day | ORAL | Status: DC

## 2015-07-07 NOTE — Addendum Note (Signed)
Addended by: Binnie Rail on: 07/07/2015 10:52 AM   Modules accepted: Orders

## 2015-07-08 ENCOUNTER — Other Ambulatory Visit: Payer: Self-pay | Admitting: Internal Medicine

## 2015-07-08 ENCOUNTER — Encounter: Payer: Self-pay | Admitting: Cardiovascular Disease

## 2015-07-08 ENCOUNTER — Telehealth: Payer: Self-pay | Admitting: Emergency Medicine

## 2015-07-08 ENCOUNTER — Ambulatory Visit (INDEPENDENT_AMBULATORY_CARE_PROVIDER_SITE_OTHER): Payer: Managed Care, Other (non HMO) | Admitting: Cardiovascular Disease

## 2015-07-08 ENCOUNTER — Encounter: Payer: Self-pay | Admitting: Internal Medicine

## 2015-07-08 ENCOUNTER — Ambulatory Visit (INDEPENDENT_AMBULATORY_CARE_PROVIDER_SITE_OTHER): Payer: Managed Care, Other (non HMO)

## 2015-07-08 ENCOUNTER — Other Ambulatory Visit (INDEPENDENT_AMBULATORY_CARE_PROVIDER_SITE_OTHER): Payer: Managed Care, Other (non HMO)

## 2015-07-08 VITALS — BP 126/94 | HR 97 | Ht 64.0 in | Wt 151.9 lb

## 2015-07-08 DIAGNOSIS — R002 Palpitations: Secondary | ICD-10-CM

## 2015-07-08 DIAGNOSIS — I10 Essential (primary) hypertension: Secondary | ICD-10-CM

## 2015-07-08 DIAGNOSIS — R079 Chest pain, unspecified: Secondary | ICD-10-CM

## 2015-07-08 DIAGNOSIS — Z720 Tobacco use: Secondary | ICD-10-CM

## 2015-07-08 DIAGNOSIS — R600 Localized edema: Secondary | ICD-10-CM | POA: Diagnosis not present

## 2015-07-08 DIAGNOSIS — F17200 Nicotine dependence, unspecified, uncomplicated: Secondary | ICD-10-CM

## 2015-07-08 LAB — COMPREHENSIVE METABOLIC PANEL WITH GFR
ALT: 17 U/L (ref 0–35)
AST: 17 U/L (ref 0–37)
Albumin: 4.2 g/dL (ref 3.5–5.2)
Alkaline Phosphatase: 74 U/L (ref 39–117)
BUN: 8 mg/dL (ref 6–23)
CO2: 28 meq/L (ref 19–32)
Calcium: 10.1 mg/dL (ref 8.4–10.5)
Chloride: 103 meq/L (ref 96–112)
Creatinine, Ser: 0.91 mg/dL (ref 0.40–1.20)
GFR: 68.14 mL/min
Glucose, Bld: 88 mg/dL (ref 70–99)
Potassium: 4 meq/L (ref 3.5–5.1)
Sodium: 138 meq/L (ref 135–145)
Total Bilirubin: 0.3 mg/dL (ref 0.2–1.2)
Total Protein: 7.2 g/dL (ref 6.0–8.3)

## 2015-07-08 MED ORDER — FUROSEMIDE 20 MG PO TABS: 20.0000 mg | ORAL_TABLET | Freq: Every day | ORAL | Status: DC

## 2015-07-08 NOTE — Assessment & Plan Note (Signed)
History of hypertension on amlodipine. Her blood pressure today was 126/94.

## 2015-07-08 NOTE — Assessment & Plan Note (Signed)
Long history of palpitations occurring more frequently recently. We will check a 30 day event monitor

## 2015-07-08 NOTE — Assessment & Plan Note (Signed)
Complaints of chest pain with typical and atypical features. Risk factors include continued tobacco abuse, hypertension and family history. Her father had bypass surgery at age 55. She also complains of dyspnea on exertion. We will get a 2-D echo and an exercise Myoview stress test to rule out an ischemic etiology.

## 2015-07-08 NOTE — Patient Instructions (Addendum)
Medication Instructions:  Your physician recommends that you continue on your current medications as directed. Please refer to the Current Medication list given to you today.  Labwork: NONE  Testing/Procedures: Your physician has requested that you have an echocardiogram. Echocardiography is a painless test that uses sound waves to create images of your heart. It provides your doctor with information about the size and shape of your heart and how well your heart's chambers and valves are working. This procedure takes approximately one hour. There are no restrictions for this procedure.  Your physician has recommended that you wear an event monitor. Event monitors are medical devices that record the heart's electrical activity. Doctors most often [place] these monitors to diagnose arrhythmias. Arrhythmias are problems with the speed or rhythm of the heartbeat. The monitor is a small, portable device. You can wear one while you do your normal daily activities. This is usually used to diagnose what is causing palpitations/syncope (passing out).  Your physician has requested that you have en exercise stress myoview. For further information please visit [employer]. Please follow instruction sheet, as given.   Follow-Up: We request that you follow-up in: 6 weeks  with Dr Andria Rhein will receive a reminder letter in the mail two months in advance. If you don't receive a letter, please call our office to schedule the follow-up appointment.    Any Other Special Instructions Will Be Listed Below (If Applicable).     If you need a refill on your cardiac medications before your next appointment, please call your pharmacy.

## 2015-07-08 NOTE — Telephone Encounter (Signed)
Stay with 5 mg for a few days and increase to 10 mg daily if BP is elevated.

## 2015-07-08 NOTE — Telephone Encounter (Signed)
Spoke with pt to inform of medication change. Pt is asking if she should go back to 10 mg of Amlodipine or only take 5mg . Please advise.

## 2015-07-08 NOTE — Assessment & Plan Note (Addendum)
Continues to smoke  5 cigarettes a day for the past 20 years

## 2015-07-08 NOTE — Progress Notes (Signed)
07/08/2015 [place] Schechter   10-06-1960  [id number]  Primary Physician Binnie Rail, MD Primary Cardiologist: Lorretta Harp MD Renae Gloss   HPI:   Ms . Herrington is a 54 year old mildly overweight married Caucasian female mother of one, grandmother of one grandchild referred by Dr. Quay Burow for cardiovascular evaluation because of palpitations, hypertension and atypical chest pain. 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 Her risk factor profile is notable for continued tobacco abuse of 5 cigarettes a day for last 20 years, treated hypertension. Her father did have a bypass graft operation at age 54 and has an abdominal aortic aneurysm. She has never had a heart attack or stroke. She does complain of dyspnea on exertion as well as exertional chest pain and palpitations.    Current Outpatient Prescriptions  Medication Sig Dispense Refill  . buPROPion (WELLBUTRIN XL) 150 MG 24 hr tablet Take 1 tablet (150 mg total) by mouth daily. 30 tablet 5  . CALCIUM PO Take 1 tablet by mouth daily.    . cholecalciferol (VITAMIN D) 1000 UNITS tablet Take 1,000 Units by mouth daily.    Marland Kitchen CINNAMON PO Take 1 capsule by mouth daily.    . clonazePAM (KLONOPIN) 0.5 MG tablet Take 1 tablet (0.5 mg total) by mouth 3 (three) times daily as needed for anxiety. 90 tablet 0  . dicyclomine (BENTYL) 10 MG capsule Take 1 capsule (10 mg total) by mouth 3 (three) times daily before meals. 90 capsule 3  . furosemide (LASIX) 20 MG tablet Take 1 tablet (20 mg total) by mouth daily. 30 tablet 3  . Ginger, Zingiber officinalis, (GINGER PO) Take 1 tablet by mouth daily.    . Multiple Vitamins-Minerals (MULTI ADULT GUMMIES PO) Take 2 each by mouth daily.    . Na Sulfate-K Sulfate-Mg Sulf (SUPREP BOWEL PREP) SOLN Take 1 kit by mouth once. 1 Bottle 0  . OVER THE COUNTER MEDICATION Take 1 tablet by mouth daily. Med Name: TUMERIC    . pregabalin (LYRICA) 75 MG capsule Take 1 capsule (75 mg  total) by mouth 2 (two) times daily. 60 capsule 3  . QUEtiapine (SEROQUEL) 100 MG tablet Take 1 tablet (100 mg total) by mouth at bedtime. 30 tablet 3   No current facility-administered medications for this visit.    Allergies  Allergen Reactions  . Iodine Anaphylaxis and Swelling    Shell fish throat swelled, 09/28/13 @ 0027- pt states her allergy is to shellfish only, had CT Chest 2012 with contrast, w/o pre-meds and had no reaction. [facility]   . Shellfish Allergy Anaphylaxis and Swelling  . Nsaids     Hurts stomach,  Has been taking ibuprofen.     Social History   Social History  . Marital Status: Married    Spouse Name: N/A  . Number of Children: 0  . Years of Education: N/A   Occupational History  . Not on file.   Social History Main Topics  . Smoking status: Current Every Day Smoker -- 0.50 packs/day    Types: Cigarettes  . Smokeless tobacco: Never Used  . Alcohol Use: No  . Drug Use: No  . Sexual Activity: Yes   Other Topics Concern  . Not on file   Social History Narrative     Review of Systems: General: negative for chills, fever, night sweats or weight changes.  Cardiovascular: negative for chest pain, dyspnea on exertion, edema, orthopnea, palpitations, paroxysmal nocturnal  dyspnea or shortness of breath Dermatological: negative for rash Respiratory: negative for cough or wheezing Urologic: negative for hematuria Abdominal: negative for nausea, vomiting, diarrhea, bright red blood per rectum, melena, or hematemesis Neurologic: negative for visual changes, syncope, or dizziness All other systems reviewed and are otherwise negative except as noted above.    Blood pressure 126/94, pulse 97, height 5' 4"  (1.626 m), weight 151 lb 14.4 oz (68.901 kg).  General appearance: alert and no distress Neck: no adenopathy, no carotid bruit, no JVD, supple, symmetrical, trachea midline and thyroid not enlarged, symmetric, no tenderness/mass/nodules Lungs: clear to  auscultation bilaterally Heart: regular rate and rhythm, S1, S2 normal, no murmur, click, rub or gallop Extremities: extremities normal, atraumatic, no cyanosis or edema  EKG normal sinus rhythm at 97 with nonspecific ST and T-wave changes. I personally reviewed this EKG  ASSESSMENT AND PLAN:   Smoker Continues to smoke  5 cigarettes a day for the past 20 years  Essential hypertension, benign History of hypertension on amlodipine. Her blood pressure today was 126/94.  Palpitations Long history of palpitations occurring more frequently recently. We will check a 30 day event monitor  Chest pain Complaints of chest pain with typical and atypical features. Risk factors include continued tobacco abuse, hypertension and family history. Her father had bypass surgery at age 9. She also complains of dyspnea on exertion. We will get a 2-D echo and an exercise Myoview stress test to rule out an ischemic etiology.      Lorretta Harp MD FACP,FACC,FAHA, [facility] 07/08/2015 4:15 PM

## 2015-07-09 MED ORDER — AMLODIPINE BESYLATE 10 MG PO TABS: 10.0000 mg | ORAL_TABLET | Freq: Every day | ORAL | Status: DC

## 2015-07-10 ENCOUNTER — Other Ambulatory Visit: Payer: Self-pay | Admitting: Cardiovascular Disease

## 2015-07-10 DIAGNOSIS — R079 Chest pain, unspecified: Secondary | ICD-10-CM

## 2015-07-10 DIAGNOSIS — R002 Palpitations: Secondary | ICD-10-CM

## 2015-07-10 NOTE — Telephone Encounter (Signed)
Please advise 

## 2015-07-17 ENCOUNTER — Telehealth (HOSPITAL_COMMUNITY): Payer: Self-pay

## 2015-07-17 NOTE — Telephone Encounter (Signed)
Encounter complete. 

## 2015-07-18 ENCOUNTER — Telehealth: Payer: Self-pay | Admitting: *Deleted

## 2015-07-18 MED ORDER — CLONAZEPAM 0.5 MG PO TABS: 0.5000 mg | ORAL_TABLET | Freq: Three times a day (TID) | ORAL | Status: DC | PRN

## 2015-07-18 NOTE — Telephone Encounter (Signed)
Received call pt requesting refill on her Clonazepam.../lmb

## 2015-07-19 ENCOUNTER — Ambulatory Visit (HOSPITAL_COMMUNITY)
Admission: RE | Admit: 2015-07-19 | Discharge: 2015-07-19 | Disposition: A | Discharge status: Home / Self Care | Payer: Managed Care, Other (non HMO) | Source: Ambulatory Visit | Attending: Cardiovascular Disease | Admitting: Cardiovascular Disease

## 2015-07-19 ENCOUNTER — Ambulatory Visit (HOSPITAL_BASED_OUTPATIENT_CLINIC_OR_DEPARTMENT_OTHER)
Admission: RE | Admit: 2015-07-19 | Discharge: 2015-07-19 | Disposition: A | Discharge status: Home / Self Care | Payer: Managed Care, Other (non HMO) | Source: Ambulatory Visit | Attending: Cardiovascular Disease | Admitting: Cardiovascular Disease

## 2015-07-19 DIAGNOSIS — R0609 Other forms of dyspnea: Secondary | ICD-10-CM | POA: Insufficient documentation

## 2015-07-19 DIAGNOSIS — F17200 Nicotine dependence, unspecified, uncomplicated: Secondary | ICD-10-CM | POA: Insufficient documentation

## 2015-07-19 DIAGNOSIS — R0602 Shortness of breath: Secondary | ICD-10-CM | POA: Insufficient documentation

## 2015-07-19 DIAGNOSIS — R079 Chest pain, unspecified: Secondary | ICD-10-CM | POA: Insufficient documentation

## 2015-07-19 DIAGNOSIS — I10 Essential (primary) hypertension: Secondary | ICD-10-CM | POA: Insufficient documentation

## 2015-07-19 DIAGNOSIS — Z8249 Family history of ischemic heart disease and other diseases of the circulatory system: Secondary | ICD-10-CM | POA: Diagnosis not present

## 2015-07-19 DIAGNOSIS — R002 Palpitations: Secondary | ICD-10-CM | POA: Diagnosis not present

## 2015-07-19 DIAGNOSIS — I071 Rheumatic tricuspid insufficiency: Secondary | ICD-10-CM | POA: Insufficient documentation

## 2015-07-19 LAB — MYOCARDIAL PERFUSION IMAGING
CHL CUP NUCLEAR SDS: 0
CHL CUP NUCLEAR SRS: 0
CHL CUP RESTING HR STRESS: 83 {beats}/min
CHL CUP STRESS STAGE 2 GRADE: 0 %
CHL CUP STRESS STAGE 2 SPEED: 1 mph
CHL CUP STRESS STAGE 3 GRADE: 0.3 %
CHL CUP STRESS STAGE 3 HR: 96 {beats}/min
CHL CUP STRESS STAGE 3 SPEED: 1 mph
CHL CUP STRESS STAGE 4 DBP: 83 mmHg
CHL CUP STRESS STAGE 4 GRADE: 10 %
CHL CUP STRESS STAGE 5 DBP: 82 mmHg
CHL CUP STRESS STAGE 5 SBP: 132 mmHg
CHL CUP STRESS STAGE 6 SPEED: 3.4 mph
CHL CUP STRESS STAGE 7 SPEED: 3.4 mph
CHL CUP STRESS STAGE 8 DBP: 83 mmHg
CHL CUP STRESS STAGE 8 GRADE: 0 %
CHL CUP STRESS STAGE 8 SBP: 124 mmHg
CHL CUP STRESS STAGE 8 SPEED: 0 mph
CHL CUP STRESS STAGE 9 DBP: 80 mmHg
CHL CUP STRESS STAGE 9 SBP: 115 mmHg
CHL RATE OF PERCEIVED EXERTION: 17
CSEPED: 9 min
CSEPEW: 10.1 METS
CSEPHR: 92 %
CSEPPHR: 151 {beats}/min
CSEPPMHR: 91 %
LV dias vol: 79 mL
LV sys vol: 26 mL
MPHR: 165 {beats}/min
SSS: 0
Stage 1 DBP: 91 mmHg
Stage 1 Grade: 0 %
Stage 1 HR: 93 {beats}/min
Stage 1 SBP: 135 mmHg
Stage 1 Speed: 0 mph
Stage 2 HR: 96 {beats}/min
Stage 4 HR: 111 {beats}/min
Stage 4 SBP: 146 mmHg
Stage 4 Speed: 1.7 mph
Stage 5 Grade: 12 %
Stage 5 HR: 127 {beats}/min
Stage 5 Speed: 2.5 mph
Stage 6 DBP: 80 mmHg
Stage 6 Grade: 14 %
Stage 6 HR: 151 {beats}/min
Stage 6 SBP: 148 mmHg
Stage 7 Grade: 14 %
Stage 7 HR: 151 {beats}/min
Stage 8 HR: 142 {beats}/min
Stage 9 Grade: 0 %
Stage 9 HR: 98 {beats}/min
Stage 9 Speed: 0 mph
TID: 0.98

## 2015-07-19 MED ORDER — TECHNETIUM TC 99M SESTAMIBI GENERIC - CARDIOLITE
32.4000 | Freq: Once | INTRAVENOUS | Status: AC | PRN
  Administered 2015-07-19: 32.4 via INTRAVENOUS

## 2015-07-19 MED ORDER — TECHNETIUM TC 99M SESTAMIBI GENERIC - CARDIOLITE
10.6000 | Freq: Once | INTRAVENOUS | Status: AC | PRN
  Administered 2015-07-19: 10.6 via INTRAVENOUS

## 2015-07-23 NOTE — Telephone Encounter (Addendum)
Per chart md ok refill on 12/29. Called pharmacy to verify if they received script. Spoke with Nathan/pharmacist he states they did not received. Gave md authorization from 12/29 for pt clonazepam. Notified pt refill was call into pharmacy....Yolanda [name]

## 2015-07-23 NOTE — Telephone Encounter (Signed)
Pt called stating [facility] did not receive the prescription for her Clonazepam

## 2015-07-26 ENCOUNTER — Encounter: Payer: Self-pay | Admitting: Internal Medicine

## 2015-07-27 MED ORDER — PREGABALIN 100 MG PO CAPS: 100.0000 mg | ORAL_CAPSULE | Freq: Two times a day (BID) | ORAL | Status: DC

## 2015-08-01 ENCOUNTER — Encounter: Payer: Self-pay | Admitting: Gastroenterology

## 2015-08-01 ENCOUNTER — Ambulatory Visit (AMBULATORY_SURGERY_CENTER): Payer: Managed Care, Other (non HMO) | Admitting: Gastroenterology

## 2015-08-01 VITALS — BP 129/88 | HR 78 | Temp 95.6°F | Resp 14 | Ht 62.0 in | Wt 145.0 lb

## 2015-08-01 DIAGNOSIS — D129 Benign neoplasm of anus and anal canal: Secondary | ICD-10-CM

## 2015-08-01 DIAGNOSIS — R197 Diarrhea, unspecified: Secondary | ICD-10-CM | POA: Diagnosis present

## 2015-08-01 DIAGNOSIS — R1084 Generalized abdominal pain: Secondary | ICD-10-CM

## 2015-08-01 DIAGNOSIS — D128 Benign neoplasm of rectum: Secondary | ICD-10-CM

## 2015-08-01 MED ORDER — SODIUM CHLORIDE 0.9 % IV SOLN: 500.0000 mL | INTRAVENOUS | Status: DC

## 2015-08-01 NOTE — Progress Notes (Signed)
Called to room to assist during endoscopic procedure.  Patient ID and intended procedure confirmed with present staff. Received instructions for my participation in the procedure from the performing physician.  

## 2015-08-01 NOTE — Op Note (Signed)
[facility]  [address] [place], [id number]   COLONOSCOPY PROCEDURE REPORT  PATIENT: [name], Yolanda Barter  MR#: [id number] BIRTHDATE: 1960-07-29 , 9  yrs. old GENDER: female ENDOSCOPIST: Harl Bowie, MD REFERRED [id number] Burns MD PROCEDURE DATE:  08/01/2015 PROCEDURE:   Colonoscopy, diagnostic, Colonoscopy with biopsy, and Colonoscopy with snare polypectomy First Screening Colonoscopy - Avg.  risk and is 50 yrs.  old or older - No.  Prior Negative Screening - Now for repeat screening. N/A  History of Adenoma - Now for follow-up colonoscopy & has been > or = to 3 yrs.  Yes hx of adenoma.  Has been 3 or more years since last colonoscopy.  History of Adenoma - Now for follow-up colonoscopy & has been > or = to 3 yrs.  No.  It has been less than 3 yrs since last colonoscopy.  Other: See Comments  Polyps removed today? Yes ASA CLASS:   Class II INDICATIONS:Clinically significant diarrhea of unexplained origin and PH Colon Adenoma. MEDICATIONS: Propofol 200 mg IV  DESCRIPTION OF PROCEDURE:   After the risks benefits and alternatives of the procedure were thoroughly explained, informed consent was obtained.  The digital rectal exam revealed no abnormalities of the rectum.   The LB [id number]  endoscope was introduced through the anus and advanced to the terminal ileum which was intubated for a short distance. No adverse events experienced.   The quality of the prep was good.  The instrument was then slowly withdrawn as the colon was fully examined. Estimated blood loss is zero unless otherwise noted in this procedure report.    COLON FINDINGS: Two sessile polyps ranging between 5-12 mm in size were found in the rectum.  Polypectomies were performed with a cold snare.  The resection was complete, the polyp tissue was completely retrieved and sent to histology.  Retroflexed views revealed no abnormalities. terminal ileum and rest the colon mucosa  appeared normal random biopsies were obtained from the colon. The time to cecum = 8.8 Withdrawal time = 17.7   The scope was withdrawn and the procedure completed. COMPLICATIONS: There were no immediate complications.  ENDOSCOPIC IMPRESSION: Two sessile polyps ranging between 5-70mm in size were found in the rectum; polypectomies were performed with a cold snare  RECOMMENDATIONS: 1.  Await pathology results 2.  If the polyp(s) removed today are proven to be adenomatous (pre-cancerous) polyps, you will need a colonoscopy in 3 years. Otherwise you should continue to follow colorectal cancer screening guidelines for "routine risk" patients with a colonoscopy in 10 years.  You will receive a letter within 1-2 weeks with the results of your biopsy as well as final recommendations.  Please call my office if you have not received a letter after 3 weeks.  eSigned:  Harl Bowie, MD 08/01/2015 2:29 PM

## 2015-08-01 NOTE — Progress Notes (Signed)
A/ox3 pleased with MAC, report to Celia RN 

## 2015-08-01 NOTE — Op Note (Signed)
[facility]  [address]. [place], [address]   ENDOSCOPY PROCEDURE REPORT  PATIENT: Yolanda [name], Yolanda [name]  MR#: [id number] BIRTHDATE: 1960-11-04 , 89  yrs. old GENDER: female ENDOSCOPIST: Harl Bowie, MD REFERRED BY:  Billey Gosling MD PROCEDURE DATE:  08/01/2015 PROCEDURE:  EGD w/ biopsy and EGD, diagnostic ASA CLASS:     Class II INDICATIONS:  epigastric pain and dyspepsia. MEDICATIONS: Propofol 150 mg IV TOPICAL ANESTHETIC: none  DESCRIPTION OF PROCEDURE: After the risks benefits and alternatives of the procedure were thoroughly explained, informed consent was obtained.  The LB [id number] endoscope was introduced through the mouth and advanced to the second portion of the duodenum , Without limitations.  The instrument was slowly withdrawn as the mucosa was fully examined.    LA grade C erosive esophagitis in the distal esophagus near the GE junction.  Small hiatal hernia.  gastric mucosa appeared normal.  2 mm nodule in the duodenum was biopsied otherwise duodenal mucosa appeared normal.  Retroflexed views revealed a hiatal hernia. The scope was then withdrawn from the patient and the procedure completed.  COMPLICATIONS: There were no immediate complications.  ENDOSCOPIC IMPRESSION: LA grade C erosive esophagitis in the distal esophagus near the GE junction.  Small hiatal hernia.  gastric mucosa appeared normal.  2 mm nodule in the duodenum was biopsied otherwise duodenal mucosa appeared normal  RECOMMENDATIONS: 1.  Await pathology results 2.  Avoid NSAIDS 3.  Anti-reflux regimen to be follow 4.  Continue PPI    eSigned:  Harl Bowie, MD 08/01/2015 2:23 PM

## 2015-08-01 NOTE — Patient Instructions (Signed)
Discharge instructions given. Handouts on polyps,hiatal hernia and esophagitis. Avoid NSAIDS. Resume previous medications. YOU HAD AN ENDOSCOPIC PROCEDURE TODAY AT [facility]:   Refer to the procedure report that was given to you for any specific questions about what was found during the examination.  If the procedure report does not answer your questions, please call your gastroenterologist to clarify.  If you requested that your care partner not be given the details of your procedure findings, then the procedure report has been included in a sealed envelope for you to review at your convenience later.  YOU SHOULD EXPECT: Some feelings of bloating in the abdomen. Passage of more gas than usual.  Walking can help get rid of the air that was put into your GI tract during the procedure and reduce the bloating. If you had a lower endoscopy (such as a colonoscopy or flexible sigmoidoscopy) you may notice spotting of blood in your stool or on the toilet paper. If you underwent a bowel prep for your procedure, you may not have a normal bowel movement for a few days.  Please Note:  You might notice some irritation and congestion in your nose or some drainage.  This is from the oxygen used during your procedure.  There is no need for concern and it should clear up in a day or so.  SYMPTOMS TO REPORT IMMEDIATELY:   Following lower endoscopy (colonoscopy or flexible sigmoidoscopy):  Excessive amounts of blood in the stool  Significant tenderness or worsening of abdominal pains  Swelling of the abdomen that is new, acute  Fever of 100F or higher   Following upper endoscopy (EGD)  Vomiting of blood or coffee ground material  New chest pain or pain under the shoulder blades  Painful or persistently difficult swallowing  New shortness of breath  Fever of 100F or higher  Black, tarry-looking stools  For urgent or emergent issues, a gastroenterologist can be reached at any hour by  calling [phone].   DIET: Your first meal following the procedure should be a small meal and then it is ok to progress to your normal diet. Heavy or fried foods are harder to digest and may make you feel nauseous or bloated.  Likewise, meals heavy in dairy and vegetables can increase bloating.  Drink plenty of fluids but you should avoid alcoholic beverages for 24 hours.  ACTIVITY:  You should plan to take it easy for the rest of today and you should NOT DRIVE or use heavy machinery until tomorrow (because of the sedation medicines used during the test).    FOLLOW UP: Our staff will call the number listed on your records the next business day following your procedure to check on you and address any questions or concerns that you may have regarding the information given to you following your procedure. If we do not reach you, we will leave a message.  However, if you are feeling well and you are not experiencing any problems, there is no need to return our call.  We will assume that you have returned to your regular daily activities without incident.  If any biopsies were taken you will be contacted by phone or by letter within the next 1-3 weeks.  Please call us at [phone] if you have not heard about the biopsies in 3 weeks.    SIGNATURES/CONFIDENTIALITY: You and/or your care partner have signed paperwork which will be entered into your electronic medical record.  These signatures attest to the  fact that that the information above on your After Visit Summary has been reviewed and is understood.  Full responsibility of the confidentiality of this discharge information lies with you and/or your care-partner.

## 2015-08-01 NOTE — Progress Notes (Signed)
Pt has reddened areas left and rt upper chest ,pt says is from leads from hollter monitor she has been wearing later but pt does not have monitor on today/.will put ekg leads on at last minute before procedure.

## 2015-08-02 ENCOUNTER — Telehealth: Payer: Self-pay

## 2015-08-02 NOTE — Telephone Encounter (Signed)
  Follow up Call-  Call back number 08/01/2015 12/09/2012  Post procedure Call Back phone  # [phone] or [phone]  Permission to leave phone message Yes Yes     Patient questions:  Do you have a fever, pain , or abdominal swelling? No. Pain Score  0 *  Have you tolerated food without any problems? Yes.    Have you been able to return to your normal activities? Yes.    Do you have any questions about your discharge instructions: Diet   No. Medications  No. Follow up visit  No.  Do you have questions or concerns about your Care? No.  Actions: * If pain score is 4 or above: No action needed, pain <4.

## 2015-08-04 ENCOUNTER — Encounter: Payer: Self-pay | Admitting: Gastroenterology

## 2015-08-12 ENCOUNTER — Encounter: Payer: Self-pay | Admitting: Gastroenterology

## 2015-08-20 ENCOUNTER — Ambulatory Visit: Payer: Managed Care, Other (non HMO) | Admitting: Cardiovascular Disease

## 2015-08-21 ENCOUNTER — Ambulatory Visit: Payer: Self-pay | Admitting: Cardiovascular Disease

## 2015-08-22 ENCOUNTER — Telehealth: Payer: Self-pay | Admitting: Emergency Medicine

## 2015-08-22 NOTE — Telephone Encounter (Signed)
Ok to refill 

## 2015-08-22 NOTE — Telephone Encounter (Signed)
Refill request for Clonazepam, last OV 06/21/15. Last refill 07/18/15 #90 3xs daily prn  Okay to fill?

## 2015-08-23 MED ORDER — CLONAZEPAM 0.5 MG PO TABS: 0.5000 mg | ORAL_TABLET | Freq: Three times a day (TID) | ORAL | Status: DC | PRN

## 2015-08-23 NOTE — Telephone Encounter (Signed)
Called refill into pharmacy spoke with Nathan/pharmacist gave md authorization...Yolanda [name]

## 2015-09-08 ENCOUNTER — Encounter: Payer: Self-pay | Admitting: Internal Medicine

## 2015-09-09 ENCOUNTER — Telehealth: Payer: Self-pay | Admitting: Internal Medicine

## 2015-09-09 NOTE — Telephone Encounter (Signed)
Please advise 

## 2015-09-09 NOTE — Telephone Encounter (Signed)
Pt left an email for Dr. Quay Burow to look at and she didn't realize it could be a while before she responds. She's having issues with her medication of Lyrica. She states it was working for a while and now not so much and is wondering if there needs to be an adjustment Can you please call her

## 2015-09-10 NOTE — Telephone Encounter (Signed)
Patient is following up. Please advise. 

## 2015-09-10 NOTE — Telephone Encounter (Signed)
She probably needs to come in - there is no easy solution

## 2015-09-11 NOTE — Telephone Encounter (Signed)
Spoke with pt. She has an appt with [place] for next week the 28th.

## 2015-09-17 ENCOUNTER — Ambulatory Visit (INDEPENDENT_AMBULATORY_CARE_PROVIDER_SITE_OTHER): Payer: Managed Care, Other (non HMO) | Admitting: Internal Medicine

## 2015-09-17 ENCOUNTER — Encounter: Payer: Self-pay | Admitting: Internal Medicine

## 2015-09-17 VITALS — BP 122/84 | HR 106 | Temp 97.7°F | Resp 16 | Wt 146.0 lb

## 2015-09-17 DIAGNOSIS — Z86010 Personal history of colonic polyps: Secondary | ICD-10-CM | POA: Insufficient documentation

## 2015-09-17 DIAGNOSIS — M797 Fibromyalgia: Secondary | ICD-10-CM

## 2015-09-17 DIAGNOSIS — G4700 Insomnia, unspecified: Secondary | ICD-10-CM | POA: Diagnosis not present

## 2015-09-17 DIAGNOSIS — R600 Localized edema: Secondary | ICD-10-CM

## 2015-09-17 DIAGNOSIS — K2100 Gastro-esophageal reflux disease with esophagitis, without bleeding: Secondary | ICD-10-CM

## 2015-09-17 DIAGNOSIS — I10 Essential (primary) hypertension: Secondary | ICD-10-CM

## 2015-09-17 DIAGNOSIS — R609 Edema, unspecified: Secondary | ICD-10-CM | POA: Insufficient documentation

## 2015-09-17 DIAGNOSIS — F32A Depression, unspecified: Secondary | ICD-10-CM

## 2015-09-17 DIAGNOSIS — K219 Gastro-esophageal reflux disease without esophagitis: Secondary | ICD-10-CM | POA: Insufficient documentation

## 2015-09-17 DIAGNOSIS — F411 Generalized anxiety disorder: Secondary | ICD-10-CM

## 2015-09-17 DIAGNOSIS — F329 Major depressive disorder, single episode, unspecified: Secondary | ICD-10-CM

## 2015-09-17 MED ORDER — OMEPRAZOLE 20 MG PO CPDR: 20.0000 mg | DELAYED_RELEASE_CAPSULE | Freq: Every day | ORAL | Status: DC

## 2015-09-17 MED ORDER — PREGABALIN 150 MG PO CAPS: 150.0000 mg | ORAL_CAPSULE | Freq: Two times a day (BID) | ORAL | Status: DC

## 2015-09-17 NOTE — Progress Notes (Signed)
Pre visit review using our clinic review tool, if applicable. No additional management support is needed unless otherwise documented below in the visit note. 

## 2015-09-17 NOTE — Assessment & Plan Note (Signed)
Lyrica 100 mg twice daily was initially effective and then stop working. She stopped the medication 2 weeks ago Start Lyrica 150 mg twice daily-she will let me know if this is not effective Avoid narcotics

## 2015-09-17 NOTE — Patient Instructions (Addendum)
  Medications reviewed and updated.  Changes include adding omeprazole for your stomach and increasing lyrica to 150 mg twice daily.   Your prescription(s) have been submitted to your pharmacy. Please take as directed and contact our office if you believe you are having problem(s) with the medication(s).  Please schedule followup in 3 months

## 2015-09-17 NOTE — Assessment & Plan Note (Signed)
Taking Wellbutrin 150 mg daily Taking clonazepam for anxiety Controlled, stable Continue current medications

## 2015-09-17 NOTE — Progress Notes (Signed)
Subjective:    Patient ID: Yolanda [name], female    DOB: 1961/07/11, 55 y.o.   MRN: [id number]  HPI She is here for follow-up.  Fibromyalgia:  She was taking lyrica 100 mg twice a day and it was working well, it helped her nerve pain and her "heat feeling" episodes.  About three weeks ago it stopped helping.  She took it for another week and she stopped taking it two weeks ago.  She did have leftover norco and oxycodone at home and took them.    Insomnia;  She is taking the seroquel nightly and it is working well.    Hypertension: She is taking her medication daily. She is compliant with a low sodium diet.  She denies chest pain, palpitations, shortness of breath and regular headaches. She is not exercising regularly, but is more than she was when she first started seeing me.  She does not monitor her blood pressure at home.    GERD:  She had an EGD last  month and was advised to take prilosec, but needs a prescription.  She has frequent GERD.  She has changed her diet and is cutting out some of the foods that typically cause GERD    Depression: She is taking her medication daily as prescribed. She denies any side effects from the medication. She feels her depression is well controlled and she is happy with her current dose of medication.    Medications and allergies reviewed with patient and updated if appropriate.  Patient Active Problem List   Diagnosis Date Noted  . Palpitations 07/08/2015  . Chest pain 07/08/2015  . Smoker 06/23/2015  . Fibromyalgia 05/24/2015  . Spinal stenosis of lumbar region 05/24/2015  . Colitis 05/24/2015  . Joint pain 05/24/2015  . Hyperglycemia 05/24/2015  . Essential hypertension, benign 05/24/2015  . Thyroid activity decreased 05/24/2015  . Insomnia 05/24/2015  . Generalized anxiety disorder 05/24/2015  . Depression 05/24/2015    Current Outpatient Prescriptions on File Prior to Visit  Medication Sig Dispense Refill  . amLODipine (NORVASC) 10  MG tablet Take 1 tablet (10 mg total) by mouth daily. 90 tablet 3  . buPROPion (WELLBUTRIN XL) 150 MG 24 hr tablet Take 1 tablet (150 mg total) by mouth daily. 30 tablet 5  . CALCIUM PO Take 1 tablet by mouth daily.    . cholecalciferol (VITAMIN D) 1000 UNITS tablet Take 1,000 Units by mouth daily.    Marland Kitchen CINNAMON PO Take 1 capsule by mouth daily.    . clonazePAM (KLONOPIN) 0.5 MG tablet Take 1 tablet (0.5 mg total) by mouth 3 (three) times daily as needed for anxiety. 90 tablet 0  . dicyclomine (BENTYL) 10 MG capsule Take 1 capsule (10 mg total) by mouth 3 (three) times daily before meals. 90 capsule 3  . furosemide (LASIX) 20 MG tablet Take 1 tablet (20 mg total) by mouth daily. 30 tablet 3  . Ginger, Zingiber officinalis, (GINGER PO) Take 1 tablet by mouth daily.    . Multiple Vitamins-Minerals (MULTI ADULT GUMMIES PO) Take 2 each by mouth daily.    . Na Sulfate-K Sulfate-Mg Sulf (SUPREP BOWEL PREP) SOLN Take 1 kit by mouth once. 1 Bottle 0  . OVER THE COUNTER MEDICATION Take 1 tablet by mouth daily. Med Name: TUMERIC    . pregabalin (LYRICA) 100 MG capsule Take 1 capsule (100 mg total) by mouth 2 (two) times daily.    . QUEtiapine (SEROQUEL) 100 MG tablet Take 1 tablet (  100 mg total) by mouth at bedtime. 30 tablet 3   No current facility-administered medications on file prior to visit.    Past Medical History  Diagnosis Date  . Arthritis   . Fibromyalgia   . Allergy     shell fish  . Anxiety   . Thyroid disease     hypothryroid  . GERD (gastroesophageal reflux disease)   . Renal disorder     kidney stones  . Seizures ([place])     from Cymbalta  . HTN (hypertension)   . Abdominal hernia   . Hx of adenomatous colonic polyps   . Gallstones   . Palpitations   . Atypical chest pain     Past Surgical History  Procedure Laterality Date  . Cholecystectomy    . Tubal ligation      Social History   Social History  . Marital Status: Married    Spouse Name: N/A  . Number of  Children: 0  . Years of Education: N/A   Social History Main Topics  . Smoking status: Current Every Day Smoker -- 0.50 packs/day    Types: Cigarettes  . Smokeless tobacco: Never Used  . Alcohol Use: No  . Drug Use: No  . Sexual Activity: Yes   Other Topics Concern  . None   Social History Narrative    Family History  Problem Relation Age of Onset  . Prostate cancer Father   . Heart disease Father   . Colon cancer Paternal Aunt   . Colon cancer Paternal Aunt   . Crohn's disease Paternal Aunt   . Fibromyalgia Paternal Aunt     Review of Systems  Constitutional: Negative for fever.  Respiratory: Negative for cough, shortness of breath and wheezing.   Cardiovascular: Positive for leg swelling (feet). Negative for chest pain and palpitations.  Gastrointestinal: Negative for abdominal pain.       GERD  Musculoskeletal: Positive for myalgias.  Neurological: Negative for dizziness, light-headedness and headaches.  Psychiatric/Behavioral: The patient is nervous/anxious.        Objective:   Filed Vitals:   09/17/15 1430  BP: 122/84  Pulse: 106  Temp: 97.7 F (36.5 C)  Resp: 16   Filed Weights   09/17/15 1430  Weight: 146 lb (66.225 kg)   Body mass index is 26.7 kg/(m^2).   Physical Exam Constitutional: Appears well-developed and well-nourished. No distress.  Neck: Neck supple. No tracheal deviation present. No thyromegaly present.  No carotid bruit. No cervical adenopathy.   Cardiovascular: tachycardia, regular rhythm and normal heart sounds.   No murmur heard.  Trace feet edema Pulmonary/Chest: Effort normal and breath sounds normal. No respiratory distress. No wheezes.        Assessment & Plan:    See Problem List for Assessment and Plan of chronic medical problems.  Follow-up in 3 months, sooner if needed

## 2015-09-17 NOTE — Assessment & Plan Note (Addendum)
In feet, mild The amlodipine may be contributing, but her blood pressure is controlled Discussed that we can lower the dose, but we need to make other medication adjustments to make sure her blood pressure is well controlled No changes today Continue low-sodium diet

## 2015-09-17 NOTE — Assessment & Plan Note (Signed)
Daily symptoms Start Prilosec 20 mg daily Advised her if this is not controlling her symptoms may need to increase the medication Eventually taper her off the medication Continue GERD diet

## 2015-09-17 NOTE — Assessment & Plan Note (Signed)
Taking clonazepam daily Anxiety controlled Continue above

## 2015-09-17 NOTE — Assessment & Plan Note (Signed)
Controlled here today Continue current medication The amlodipine may be contributing to her feet swelling

## 2015-09-22 ENCOUNTER — Other Ambulatory Visit: Payer: Self-pay | Admitting: Internal Medicine

## 2015-09-22 ENCOUNTER — Encounter: Payer: Self-pay | Admitting: Internal Medicine

## 2015-09-24 ENCOUNTER — Telehealth: Payer: Self-pay | Admitting: Internal Medicine

## 2015-09-24 MED ORDER — CLONAZEPAM 0.5 MG PO TABS: 0.5000 mg | ORAL_TABLET | Freq: Three times a day (TID) | ORAL | Status: DC | PRN

## 2015-09-24 NOTE — Addendum Note (Signed)
Addended by: Binnie Rail on: 09/24/2015 01:01 PM   Modules accepted: Orders

## 2015-09-25 MED ORDER — PREGABALIN 150 MG PO CAPS: 150.0000 mg | ORAL_CAPSULE | Freq: Three times a day (TID) | ORAL | Status: DC

## 2015-09-25 MED ORDER — CLONAZEPAM 0.5 MG PO TABS: 0.5000 mg | ORAL_TABLET | Freq: Three times a day (TID) | ORAL | Status: DC | PRN

## 2015-09-25 NOTE — Telephone Encounter (Signed)
rx printed - I thought it was called in.

## 2015-09-25 NOTE — Telephone Encounter (Signed)
Pt lm on triage. Pharmacy has not received rx for clonazepam. Rx was indicated as "phone in".

## 2015-09-25 NOTE — Telephone Encounter (Signed)
Prescription printed - please fax to pharmacy

## 2015-09-25 NOTE — Addendum Note (Signed)
Addended by: Binnie Rail on: 09/25/2015 02:55 PM   Modules accepted: Orders

## 2015-09-25 NOTE — Telephone Encounter (Signed)
Called in rx to [place].   Pharmacy to contact pt when fill is complete.

## 2015-09-25 NOTE — Telephone Encounter (Signed)
No need to print and fax.   I called rx in today.

## 2015-10-03 MED ORDER — NALTREXONE-BUPROPION HCL ER 8-90 MG PO TB12
ORAL_TABLET | ORAL | Status: DC
Start: 2015-10-03 — End: 2015-11-03

## 2015-10-25 ENCOUNTER — Telehealth: Payer: Self-pay | Admitting: Internal Medicine

## 2015-10-26 ENCOUNTER — Telehealth: Payer: Self-pay | Admitting: Internal Medicine

## 2015-10-26 ENCOUNTER — Encounter: Payer: Self-pay | Admitting: Internal Medicine

## 2015-10-28 MED ORDER — CLONAZEPAM 0.5 MG PO TABS: 0.5000 mg | ORAL_TABLET | Freq: Three times a day (TID) | ORAL | Status: DC | PRN

## 2015-10-28 NOTE — Telephone Encounter (Signed)
RX request faxed to [facility]

## 2015-10-28 NOTE — Telephone Encounter (Signed)
rx printed

## 2015-10-31 ENCOUNTER — Telehealth: Payer: Self-pay | Admitting: Emergency Medicine

## 2015-10-31 NOTE — Telephone Encounter (Signed)
Lovena Le can you call this pt about this email   Cell number [phone]

## 2015-10-31 NOTE — Telephone Encounter (Signed)
Pt spoke to insurance comp and stated that they would cover the [place]. She would like to try this medication. She has currently stopped taking the last one that was prescribed.

## 2015-11-03 MED ORDER — LORCASERIN HCL 10 MG PO TABS: 1.0000 | ORAL_TABLET | Freq: Two times a day (BID) | ORAL | Status: DC

## 2015-11-03 NOTE — Telephone Encounter (Signed)
Prescription printed

## 2015-11-04 NOTE — Telephone Encounter (Signed)
RX has been called into pharmacy. Pt is aware. Pt would like to know if she should stop the Wellbutrin. She read that Wellbutrin should not be taken with the [place]. She is currently taking Wellbutrin to stop smoking and has only cut back and has not completely quit. Please advise.

## 2015-11-04 NOTE — Telephone Encounter (Signed)
Pt has been informed via MyChart.

## 2015-11-04 NOTE — Telephone Encounter (Signed)
There is no interaction and she can continue to take both.  wellbutrin sometimes can promote weight loss - so that is likely the concern, but since this has not been the Cara for her she can continue the wellbutrin is she wants.

## 2015-11-04 NOTE — Telephone Encounter (Signed)
Pt called to follow up on this. Can you please send this prescription to [place]

## 2015-11-05 ENCOUNTER — Other Ambulatory Visit: Payer: Self-pay | Admitting: Internal Medicine

## 2015-11-07 MED ORDER — CLONAZEPAM 0.5 MG PO TABS: 0.5000 mg | ORAL_TABLET | Freq: Three times a day (TID) | ORAL | Status: DC | PRN

## 2015-11-07 NOTE — Telephone Encounter (Signed)
RX faxed to [facility] 

## 2015-11-07 NOTE — Addendum Note (Signed)
Addended by: Binnie Rail on: 11/07/2015 01:49 PM   Modules accepted: Orders

## 2015-11-07 NOTE — Telephone Encounter (Signed)
printed

## 2015-12-17 ENCOUNTER — Encounter: Payer: Self-pay | Admitting: Internal Medicine

## 2015-12-17 ENCOUNTER — Ambulatory Visit (INDEPENDENT_AMBULATORY_CARE_PROVIDER_SITE_OTHER): Payer: Managed Care, Other (non HMO) | Admitting: Internal Medicine

## 2015-12-17 VITALS — BP 120/92 | HR 93 | Temp 97.6°F | Resp 16 | Wt 148.0 lb

## 2015-12-17 DIAGNOSIS — M4806 Spinal stenosis, lumbar region: Secondary | ICD-10-CM

## 2015-12-17 DIAGNOSIS — F411 Generalized anxiety disorder: Secondary | ICD-10-CM

## 2015-12-17 DIAGNOSIS — M797 Fibromyalgia: Secondary | ICD-10-CM

## 2015-12-17 DIAGNOSIS — F32A Depression, unspecified: Secondary | ICD-10-CM

## 2015-12-17 DIAGNOSIS — I10 Essential (primary) hypertension: Secondary | ICD-10-CM

## 2015-12-17 DIAGNOSIS — J209 Acute bronchitis, unspecified: Secondary | ICD-10-CM | POA: Insufficient documentation

## 2015-12-17 DIAGNOSIS — M48061 Spinal stenosis, lumbar region without neurogenic claudication: Secondary | ICD-10-CM

## 2015-12-17 DIAGNOSIS — F329 Major depressive disorder, single episode, unspecified: Secondary | ICD-10-CM

## 2015-12-17 DIAGNOSIS — K2100 Gastro-esophageal reflux disease with esophagitis, without bleeding: Secondary | ICD-10-CM

## 2015-12-17 MED ORDER — DOXYCYCLINE HYCLATE 100 MG PO TABS: 100.0000 mg | ORAL_TABLET | Freq: Two times a day (BID) | ORAL | Status: DC

## 2015-12-17 NOTE — Assessment & Plan Note (Signed)
Continue Lyrica, which does seem to be helping Encouraged regular exercise

## 2015-12-17 NOTE — Patient Instructions (Signed)
We will start an antibiotic - called doxycycline for your cold symptoms.    Medications reviewed and updated.  No changes recommended at this time.  Your prescription(s) have been submitted to your pharmacy. Please take as directed and contact our office if you believe you are having problem(s) with the medication(s).  A referral was ordered for pain management  Please followup in 6 months

## 2015-12-17 NOTE — Assessment & Plan Note (Signed)
GERD controlled She is compliant with a GERD diet Continue daily medication

## 2015-12-17 NOTE — Assessment & Plan Note (Signed)
Her blood pressure is slightly elevated here today, but she did not take her medication No change in medication needed

## 2015-12-17 NOTE — Assessment & Plan Note (Signed)
Encouraged smoking cessation Will start antibiotic-doxycycline Call if no improvement line can continue over-the-counter cold medications for symptom relief

## 2015-12-17 NOTE — Progress Notes (Signed)
Subjective:    Patient ID: Yolanda [name], female    DOB: [birth year], 55 y.o.   MRN: [id number]  HPI She is here for follow up.  Cold symptoms:  She has had symptoms for about one week.  She has been taking tylenol and mucinex.  She has a productive cough with greenish phlegm, nasal congestion, sore throat and fever the other night.  She denies sob, wheeze, ear pain, sinus pain.  She is smoking. There has been no improvement in her symptoms since they started.  Fibromyalgia, chronic lower back pain, joint pain:  She has constant lower back pain.  She is taking the Lyrica daily.The Lyrica helps with the fibromyalgia/nerve pain.  She has joint pain.  Her knees swell at times.  She took hydrocodone 10-358m or oxycodone 10 mg, which she had leftover from her previous doctor.   She feels she had no choice and restarting the medication because her pain was so bad she did not feel like she had quality of life. The medication has helped and she thinks she needs to continue it.   She has been more active since taking her pain medication and this has led to the weight loss - she never took the weight loss medication.    Hypertension: She is taking her medication daily, but did not take it today. She is compliant with a low sodium diet.  She denies chest pain, palpitations, shortness of breath and regular headaches. She is not exercising regularly.  She does not monitor her blood pressure at home.    GERD:  She is taking her medication daily as prescribed.  She denies any GERD symptoms and feels her GERD is well controlled. She is very compliant with a GERD diet  Depression, anxiety: She is taking her medication daily as prescribed. She denies any side effects from the medication. She feels her depression and anxiety are well controlled and she is happy with her current dose of medication.     Medications and allergies reviewed with patient and updated if appropriate.  Patient Active Problem List   Diagnosis Date Noted  . GERD (gastroesophageal reflux disease) 09/17/2015  . History of colonic polyps 09/17/2015  . Edema 09/17/2015  . Palpitations 07/08/2015  . Chest pain 07/08/2015  . Smoker 06/23/2015  . Fibromyalgia 05/24/2015  . Spinal stenosis of lumbar region 05/24/2015  . Colitis 05/24/2015  . Joint pain 05/24/2015  . Hyperglycemia 05/24/2015  . Essential hypertension, benign 05/24/2015  . Insomnia 05/24/2015  . Generalized anxiety disorder 05/24/2015  . Depression 05/24/2015    Current Outpatient Prescriptions on File Prior to Visit  Medication Sig Dispense Refill  . amLODipine (NORVASC) 10 MG tablet Take 1 tablet (10 mg total) by mouth daily. 90 tablet 3  . buPROPion (WELLBUTRIN XL) 150 MG 24 hr tablet Take 1 tablet (150 mg total) by mouth daily. 30 tablet 5  . CALCIUM PO Take 1 tablet by mouth daily.    . cholecalciferol (VITAMIN D) 1000 UNITS tablet Take 1,000 Units by mouth daily.    .Marland KitchenCINNAMON PO Take 1 capsule by mouth daily.    . clonazePAM (KLONOPIN) 0.5 MG tablet Take 1 tablet (0.5 mg total) by mouth 3 (three) times daily as needed for anxiety. 90 tablet 1  . clonazePAM (KLONOPIN) 0.5 MG tablet Take 1 tablet (0.5 mg total) by mouth 3 (three) times daily as needed for anxiety. 90 tablet 0  . dicyclomine (BENTYL) 10 MG capsule Take 1 capsule (10  mg total) by mouth 3 (three) times daily before meals. 90 capsule 3  . Ginger, Zingiber officinalis, (GINGER PO) Take 1 tablet by mouth daily.    . Multiple Vitamins-Minerals (MULTI ADULT GUMMIES PO) Take 2 each by mouth daily.    . Na Sulfate-K Sulfate-Mg Sulf (SUPREP BOWEL PREP) SOLN Take 1 kit by mouth once. 1 Bottle 0  . omeprazole (PRILOSEC) 20 MG capsule Take 1 capsule (20 mg total) by mouth daily. 30 capsule 11  . OVER THE COUNTER MEDICATION Take 1 tablet by mouth daily. Med Name: TUMERIC    . pregabalin (LYRICA) 150 MG capsule Take 1 capsule (150 mg total) by mouth 3 (three) times daily. 90 capsule 2  . QUEtiapine  (SEROQUEL) 100 MG tablet TAKE (1) TABLET BY MOUTH AT BEDTIME. 30 tablet 2   No current facility-administered medications on file prior to visit.    Past Medical History  Diagnosis Date  . Arthritis   . Fibromyalgia   . Allergy     shell fish  . Anxiety   . Thyroid disease     hypothryroid  . GERD (gastroesophageal reflux disease)   . Renal disorder     kidney stones  . Seizures ([place])     from Cymbalta  . HTN (hypertension)   . Abdominal hernia   . Hx of adenomatous colonic polyps   . Gallstones   . Palpitations   . Atypical chest pain     Past Surgical History  Procedure Laterality Date  . Cholecystectomy    . Tubal ligation      Social History   Social History  . Marital Status: Married    Spouse Name: N/A  . Number of Children: 0  . Years of Education: N/A   Social History Main Topics  . Smoking status: Current Every Day Smoker -- 0.50 packs/day    Types: Cigarettes  . Smokeless tobacco: Never Used  . Alcohol Use: No  . Drug Use: No  . Sexual Activity: Yes   Other Topics Concern  . None   Social History Narrative    Family History  Problem Relation Age of Onset  . Prostate cancer Father   . Heart disease Father   . Colon cancer Paternal Aunt   . Colon cancer Paternal Aunt   . Crohn's disease Paternal Aunt   . Fibromyalgia Paternal Aunt     Review of Systems  Constitutional: Positive for fever.  HENT: Positive for congestion and sore throat. Negative for ear pain, postnasal drip and sinus pressure.   Respiratory: Positive for cough (productive of greenish brown phlegm). Negative for shortness of breath and wheezing.   Cardiovascular: Positive for leg swelling (occasional ankle edema). Negative for chest pain and palpitations.  Musculoskeletal: Positive for myalgias, back pain, joint swelling (intermittent knee swelling) and arthralgias.  Neurological: Negative for dizziness, light-headedness and headaches.       Objective:   Filed Vitals:     12/17/15 1537  BP: 120/92  Pulse: 93  Temp: 97.6 F (36.4 C)  Resp: 16   Filed Weights   12/17/15 1537  Weight: 148 lb (67.132 kg)   Body mass index is 27.06 kg/(m^2).   Physical Exam GENERAL APPEARANCE: Appears stated age, well appearing, NAD EYES: conjunctiva clear, no icterus HEENT: bilateral tympanic membranes and ear canals normal, oropharynx with no erythema, no thyromegaly, trachea midline, no cervical or supraclavicular lymphadenopathy LUNGS: unlabored breathing, good air entry bilaterally. Slight wheeze, possible rhonchi LUL > RUL HEART: Normal S1,S2  without murmurs EXTREMITIES: Trace bilateral lower extremity edema PSYCH:  normal mood and affect         Assessment & Plan:   See Problem List for Assessment and Plan of chronic medical problems.

## 2015-12-17 NOTE — Assessment & Plan Note (Signed)
Chronic lower back pain She did take pain medication that she had leftover from her previous doctor and she feels that significantly improved her quality of life and would like to restart it We'll refer to pain management for further evaluation, possible intervention and medication management

## 2015-12-17 NOTE — Assessment & Plan Note (Signed)
Controlled, stable Continue current medications  

## 2015-12-17 NOTE — Progress Notes (Signed)
Pre visit review using our clinic review tool, if applicable. No additional management support is needed unless otherwise documented below in the visit note. 

## 2015-12-17 NOTE — Assessment & Plan Note (Signed)
Overall controlled, stable Continue clonazepam

## 2015-12-18 ENCOUNTER — Telehealth: Payer: Self-pay | Admitting: Internal Medicine

## 2015-12-18 MED ORDER — PROMETHAZINE-DM 6.25-15 MG/5ML PO SYRP: 5.0000 mL | ORAL_SOLUTION | Freq: Four times a day (QID) | ORAL | Status: DC | PRN

## 2015-12-18 MED ORDER — ALBUTEROL SULFATE HFA 108 (90 BASE) MCG/ACT IN AERS: 2.0000 | INHALATION_SPRAY | Freq: Four times a day (QID) | RESPIRATORY_TRACT | Status: DC | PRN

## 2015-12-18 NOTE — Telephone Encounter (Signed)
Pt wast wondering if Dr. Quay Burow can call her in something for cough and difficulty breathing, she saw Dr. Quay Burow yesterday. Please call her back

## 2015-12-18 NOTE — Telephone Encounter (Signed)
Called and informed pt that meds was sent to pharmacy

## 2015-12-18 NOTE — Telephone Encounter (Signed)
A cough syrup and inhaler was sent to her pharmacy

## 2015-12-19 ENCOUNTER — Other Ambulatory Visit: Payer: Self-pay | Admitting: Emergency Medicine

## 2015-12-19 MED ORDER — DICYCLOMINE HCL 10 MG PO CAPS: 10.0000 mg | ORAL_CAPSULE | Freq: Three times a day (TID) | ORAL | Status: DC

## 2015-12-19 MED ORDER — CLONAZEPAM 0.5 MG PO TABS: 0.5000 mg | ORAL_TABLET | Freq: Three times a day (TID) | ORAL | Status: DC | PRN

## 2015-12-19 MED ORDER — AMLODIPINE BESYLATE 10 MG PO TABS: 10.0000 mg | ORAL_TABLET | Freq: Every day | ORAL | Status: AC

## 2015-12-19 MED ORDER — BUPROPION HCL ER (XL) 150 MG PO TB24: 150.0000 mg | ORAL_TABLET | Freq: Every day | ORAL | Status: DC

## 2015-12-19 MED ORDER — OMEPRAZOLE 20 MG PO CPDR: 20.0000 mg | DELAYED_RELEASE_CAPSULE | Freq: Every day | ORAL | Status: DC

## 2015-12-19 MED ORDER — QUETIAPINE FUMARATE 100 MG PO TABS: ORAL_TABLET | ORAL | Status: DC

## 2015-12-19 NOTE — Telephone Encounter (Signed)
Spoke with pt to clarify new pharmacy. Rxes have been sent in to both pharmacies. Pt aware.

## 2015-12-23 ENCOUNTER — Other Ambulatory Visit: Payer: Self-pay | Admitting: Internal Medicine

## 2015-12-23 ENCOUNTER — Telehealth: Payer: Self-pay | Admitting: Emergency Medicine

## 2015-12-23 NOTE — Telephone Encounter (Signed)
RX for Lyrica faxed to [facility]

## 2015-12-23 NOTE — Telephone Encounter (Signed)
Okay to send refill? Not due until the 8th

## 2016-01-27 ENCOUNTER — Other Ambulatory Visit: Payer: Self-pay | Admitting: Internal Medicine

## 2016-01-28 NOTE — Telephone Encounter (Signed)
Faxed Clonazepam to [facility].

## 2016-02-29 ENCOUNTER — Other Ambulatory Visit: Payer: Self-pay | Admitting: Internal Medicine

## 2016-03-02 NOTE — Telephone Encounter (Signed)
RX faxed to [facility] 

## 2016-03-15 ENCOUNTER — Other Ambulatory Visit: Payer: Self-pay | Admitting: Internal Medicine

## 2016-03-16 MED ORDER — PREGABALIN 150 MG PO CAPS: 150.0000 mg | ORAL_CAPSULE | Freq: Three times a day (TID) | ORAL | 0 refills | Status: DC

## 2016-03-16 NOTE — Telephone Encounter (Signed)
Prescription printed

## 2016-03-16 NOTE — Telephone Encounter (Signed)
RX faxed to [facility] 

## 2016-03-18 ENCOUNTER — Other Ambulatory Visit: Payer: Self-pay | Admitting: Internal Medicine

## 2016-03-30 ENCOUNTER — Other Ambulatory Visit: Payer: Self-pay | Admitting: Internal Medicine

## 2016-03-30 NOTE — Telephone Encounter (Signed)
RX faxed to [facility] 

## 2016-04-13 ENCOUNTER — Other Ambulatory Visit: Payer: Self-pay | Admitting: Internal Medicine

## 2016-04-14 ENCOUNTER — Other Ambulatory Visit: Payer: Self-pay | Admitting: Internal Medicine

## 2016-04-14 NOTE — Telephone Encounter (Signed)
RX faxed to [facility] 

## 2016-04-14 NOTE — Telephone Encounter (Signed)
Rec'd call pt requesting refill on Lyrica...Yolanda [name]

## 2016-04-29 ENCOUNTER — Other Ambulatory Visit: Payer: Self-pay | Admitting: Internal Medicine

## 2016-04-29 ENCOUNTER — Ambulatory Visit
Admission: RE | Admit: 2016-04-29 | Discharge: 2016-04-29 | Disposition: A | Discharge status: Home / Self Care | Payer: Managed Care, Other (non HMO) | Source: Ambulatory Visit | Attending: Anesthesiology | Admitting: Anesthesiology

## 2016-04-29 ENCOUNTER — Other Ambulatory Visit: Payer: Self-pay | Admitting: Anesthesiology

## 2016-04-29 DIAGNOSIS — M542 Cervicalgia: Secondary | ICD-10-CM

## 2016-04-29 DIAGNOSIS — M545 Low back pain: Secondary | ICD-10-CM

## 2016-04-29 MED ORDER — CLONAZEPAM 0.5 MG PO TABS: 0.5000 mg | ORAL_TABLET | Freq: Three times a day (TID) | ORAL | 0 refills | Status: DC | PRN

## 2016-04-30 ENCOUNTER — Telehealth: Payer: Self-pay | Admitting: Emergency Medicine

## 2016-04-30 NOTE — Telephone Encounter (Signed)
RX faxed to [place]

## 2016-05-12 ENCOUNTER — Other Ambulatory Visit: Payer: Self-pay | Admitting: Internal Medicine

## 2016-05-12 MED ORDER — PREGABALIN 150 MG PO CAPS: 150.0000 mg | ORAL_CAPSULE | Freq: Three times a day (TID) | ORAL | 0 refills | Status: DC

## 2016-05-12 NOTE — Telephone Encounter (Signed)
Ok to fill (printed)- should follow up for routine follow up next month - she was last seen 6 months ago and with the medication she is taking she needs to f/u every 6 motnhs

## 2016-05-13 NOTE — Telephone Encounter (Signed)
RX sent to [facility] 

## 2016-05-30 ENCOUNTER — Other Ambulatory Visit: Payer: Self-pay | Admitting: Internal Medicine

## 2016-06-01 NOTE — Telephone Encounter (Signed)
RX faxed to [facility] 

## 2016-06-12 ENCOUNTER — Other Ambulatory Visit: Payer: Self-pay | Admitting: Internal Medicine

## 2016-06-12 ENCOUNTER — Encounter: Payer: Self-pay | Admitting: Internal Medicine

## 2016-06-15 ENCOUNTER — Telehealth: Payer: Self-pay | Admitting: Internal Medicine

## 2016-06-15 MED ORDER — PREGABALIN 150 MG PO CAPS: 150.0000 mg | ORAL_CAPSULE | Freq: Three times a day (TID) | ORAL | 0 refills | Status: DC

## 2016-06-15 NOTE — Telephone Encounter (Signed)
Patient has scheduled CPE with Dr. Quay Burow for 07/22/16.  Patient needs refill on Lyrica to get her through until that time.

## 2016-06-15 NOTE — Telephone Encounter (Signed)
Lyrica was sent to [facility] this morning.

## 2016-06-15 NOTE — Telephone Encounter (Signed)
RX faxed to [facility] 

## 2016-06-16 ENCOUNTER — Other Ambulatory Visit: Payer: Self-pay | Admitting: Internal Medicine

## 2016-06-17 ENCOUNTER — Encounter: Payer: Self-pay | Admitting: Gastroenterology

## 2016-06-22 ENCOUNTER — Telehealth: Payer: Self-pay | Admitting: Internal Medicine

## 2016-06-22 DIAGNOSIS — Z0000 Encounter for general adult medical examination without abnormal findings: Secondary | ICD-10-CM

## 2016-06-22 NOTE — Telephone Encounter (Signed)
Patient has CPE scheduled for 1/3.  Patient is requesting Dr. Quay Burow to enter lab work so she can have done on 12/18th.  Please follow up in regard.

## 2016-06-22 NOTE — Telephone Encounter (Signed)
Labs ordered.

## 2016-06-22 NOTE — Telephone Encounter (Signed)
Per chart insurance is [place]. Is standard cpx labs ok to be entered...Johny Chess

## 2016-06-22 NOTE — Telephone Encounter (Signed)
Notified pt labs has been entered.../lmb 

## 2016-06-26 ENCOUNTER — Other Ambulatory Visit: Payer: Self-pay | Admitting: Internal Medicine

## 2016-06-26 ENCOUNTER — Encounter: Payer: Self-pay | Admitting: Internal Medicine

## 2016-06-27 MED ORDER — CLONAZEPAM 0.5 MG PO TABS: 0.5000 mg | ORAL_TABLET | Freq: Three times a day (TID) | ORAL | 0 refills | Status: DC | PRN

## 2016-06-27 NOTE — Telephone Encounter (Signed)
[place] controlled substance database checked.  Ok to fill medication. rx printed 

## 2016-06-29 NOTE — Telephone Encounter (Signed)
RX has been faxed.

## 2016-06-29 NOTE — Telephone Encounter (Signed)
RX faxed to [facility] 

## 2016-07-02 ENCOUNTER — Ambulatory Visit (AMBULATORY_SURGERY_CENTER): Payer: Self-pay | Admitting: *Deleted

## 2016-07-02 ENCOUNTER — Other Ambulatory Visit (INDEPENDENT_AMBULATORY_CARE_PROVIDER_SITE_OTHER): Payer: Managed Care, Other (non HMO)

## 2016-07-02 VITALS — Ht 64.5 in | Wt 150.0 lb

## 2016-07-02 DIAGNOSIS — K209 Esophagitis, unspecified without bleeding: Secondary | ICD-10-CM

## 2016-07-02 DIAGNOSIS — Z0000 Encounter for general adult medical examination without abnormal findings: Secondary | ICD-10-CM | POA: Diagnosis not present

## 2016-07-02 LAB — LIPID PANEL
CHOLESTEROL: 148 mg/dL (ref 0–200)
HDL: 50.6 mg/dL (ref >39.00)
LDL Cholesterol: 72 mg/dL (ref 0–99)
NonHDL: 97.44
Total CHOL/HDL Ratio: 3
Triglycerides: 126 mg/dL (ref 0.0–149.0)
VLDL: 25.2 mg/dL (ref 0.0–40.0)

## 2016-07-02 LAB — COMPREHENSIVE METABOLIC PANEL
ALBUMIN: 4.3 g/dL (ref 3.5–5.2)
ALK PHOS: 89 U/L (ref 39–117)
ALT: 11 U/L (ref 0–35)
AST: 11 U/L (ref 0–37)
BILIRUBIN TOTAL: 0.2 mg/dL (ref 0.2–1.2)
BUN: 12 mg/dL (ref 6–23)
CALCIUM: 9.8 mg/dL (ref 8.4–10.5)
CO2: 29 mEq/L (ref 19–32)
Chloride: 110 mEq/L (ref 96–112)
Creatinine, Ser: 0.85 mg/dL (ref 0.40–1.20)
GFR: 73.46 mL/min (ref >60.00)
Glucose, Bld: 75 mg/dL (ref 70–99)
POTASSIUM: 3.6 meq/L (ref 3.5–5.1)
Sodium: 145 mEq/L (ref 135–145)
TOTAL PROTEIN: 7.2 g/dL (ref 6.0–8.3)

## 2016-07-02 LAB — TSH: TSH: 2.66 u[IU]/mL (ref 0.35–4.50)

## 2016-07-02 NOTE — Progress Notes (Signed)
Since January pt states she has had colitis 3 times , she states the abdominal hernia has been bothering her as well where it was not   No egg or soy allergy known to patient  No issues with past sedation with any surgeries  or procedures, no intubation problems  No diet pills per patient No home 02 use per patient  No blood thinners per patient  Pt denies issues with constipation  No A fib or A flutter

## 2016-07-03 ENCOUNTER — Encounter: Payer: Self-pay | Admitting: Gastroenterology

## 2016-07-03 LAB — CBC WITH DIFFERENTIAL/PLATELET
Basophils Absolute: 0.1 10*3/uL (ref 0.0–0.1)
Basophils Relative: 0.6 % (ref 0.0–3.0)
EOS PCT: 1.9 % (ref 0.0–5.0)
Eosinophils Absolute: 0.2 10*3/uL (ref 0.0–0.7)
HEMATOCRIT: 42.6 % (ref 36.0–46.0)
HEMOGLOBIN: 14.3 g/dL (ref 12.0–15.0)
LYMPHS ABS: 2.8 10*3/uL (ref 0.7–4.0)
LYMPHS PCT: 31.5 % (ref 12.0–46.0)
MCHC: 33.5 g/dL (ref 30.0–36.0)
MCV: 98.7 fl (ref 78.0–100.0)
MONOS PCT: 1.7 % — AB (ref 3.0–12.0)
Monocytes Absolute: 0.2 10*3/uL (ref 0.1–1.0)
Neutro Abs: 5.7 10*3/uL (ref 1.4–7.7)
Neutrophils Relative %: 64.3 % (ref 43.0–77.0)
Platelets: 177 10*3/uL (ref 150.0–400.0)
RBC: 4.31 Mil/uL (ref 3.87–5.11)
RDW: 15.9 % — ABNORMAL HIGH (ref 11.5–15.5)
WBC: 8.9 10*3/uL (ref 4.0–10.5)

## 2016-07-05 ENCOUNTER — Encounter: Payer: Self-pay | Admitting: Internal Medicine

## 2016-07-15 ENCOUNTER — Other Ambulatory Visit: Payer: Self-pay | Admitting: Internal Medicine

## 2016-07-16 ENCOUNTER — Encounter: Payer: Self-pay | Admitting: Gastroenterology

## 2016-07-16 ENCOUNTER — Ambulatory Visit (AMBULATORY_SURGERY_CENTER): Payer: Managed Care, Other (non HMO) | Admitting: Gastroenterology

## 2016-07-16 VITALS — BP 127/90 | HR 75 | Temp 97.5°F | Resp 18 | Ht 64.5 in | Wt 150.0 lb

## 2016-07-16 DIAGNOSIS — K219 Gastro-esophageal reflux disease without esophagitis: Secondary | ICD-10-CM

## 2016-07-16 DIAGNOSIS — R197 Diarrhea, unspecified: Secondary | ICD-10-CM | POA: Diagnosis not present

## 2016-07-16 DIAGNOSIS — R1013 Epigastric pain: Secondary | ICD-10-CM

## 2016-07-16 DIAGNOSIS — K208 Other esophagitis without bleeding: Secondary | ICD-10-CM

## 2016-07-16 MED ORDER — LOPERAMIDE HCL 2 MG PO TABS: 2.0000 mg | ORAL_TABLET | Freq: Two times a day (BID) | ORAL | 3 refills | Status: AC | PRN

## 2016-07-16 MED ORDER — LOPERAMIDE HCL 2 MG PO CAPS: 2.0000 mg | ORAL_CAPSULE | Freq: Two times a day (BID) | ORAL | Status: DC | PRN

## 2016-07-16 MED ORDER — SODIUM CHLORIDE 0.9 % IV SOLN: 500.0000 mL | INTRAVENOUS | Status: DC

## 2016-07-16 MED ORDER — ONDANSETRON HCL 4 MG PO TABS: ORAL_TABLET | ORAL | 0 refills | Status: AC

## 2016-07-16 NOTE — Progress Notes (Signed)
Called to room to assist during endoscopic procedure.  Patient ID and intended procedure confirmed with present staff. Received instructions for my participation in the procedure from the performing physician.  

## 2016-07-16 NOTE — Patient Instructions (Signed)
YOU HAD AN ENDOSCOPIC PROCEDURE TODAY AT [facility]:   Refer to the procedure report that was given to you for any specific questions about what was found during the examination.  If the procedure report does not answer your questions, please call your gastroenterologist to clarify.  If you requested that your care partner not be given the details of your procedure findings, then the procedure report has been included in a sealed envelope for you to review at your convenience later.  YOU SHOULD EXPECT: Some feelings of bloating in the abdomen. Passage of more gas than usual.  Walking can help get rid of the air that was put into your GI tract during the procedure and reduce the bloating. If you had a lower endoscopy (such as a colonoscopy or flexible sigmoidoscopy) you may notice spotting of blood in your stool or on the toilet paper. If you underwent a bowel prep for your procedure, you may not have a normal bowel movement for a few days.  Please Note:  You might notice some irritation and congestion in your nose or some drainage.  This is from the oxygen used during your procedure.  There is no need for concern and it should clear up in a day or so.  SYMPTOMS TO REPORT IMMEDIATELY:    Following upper endoscopy (EGD)  Vomiting of blood or coffee ground material  New chest pain or pain under the shoulder blades  Painful or persistently difficult swallowing  New shortness of breath  Fever of 100F or higher  Black, tarry-looking stools  For urgent or emergent issues, a gastroenterologist can be reached at any hour by calling [phone].   DIET:  We do recommend a small meal at first, but then you may proceed to your regular diet.  Drink plenty of fluids but you should avoid alcoholic beverages for 24 hours.  ACTIVITY:  You should plan to take it easy for the rest of today and you should NOT DRIVE or use heavy machinery until tomorrow (because of the sedation medicines used  during the test).    FOLLOW UP: Our staff will call the number listed on your records the next business day following your procedure to check on you and address any questions or concerns that you may have regarding the information given to you following your procedure. If we do not reach you, we will leave a message.  However, if you are feeling well and you are not experiencing any problems, there is no need to return our call.  We will assume that you have returned to your regular daily activities without incident.  If any biopsies were taken you will be contacted by phone or by letter within the next 1-3 weeks.  Please call us at [phone] if you have not heard about the biopsies in 3 weeks.    SIGNATURES/CONFIDENTIALITY: You and/or your care partner have signed paperwork which will be entered into your electronic medical record.  These signatures attest to the fact that that the information above on your After Visit Summary has been reviewed and is understood.  Full responsibility of the confidentiality of this discharge information lies with you and/or your care-partner.  AWAIT BIOPSY RESULTS. RETURN TO GI CLINIC AS NEEDED

## 2016-07-16 NOTE — Progress Notes (Signed)
A/ox3 pleased with MAC, report to Jane RN 

## 2016-07-16 NOTE — Op Note (Signed)
[facility] Patient Name: Yolanda [name] Procedure Date: 07/16/2016 10:21 AM MRN: [id number] Endoscopist: Mauri Pole , MD Age: 55 Referring MD:  Date of Birth: 1961-03-09 Gender: Female Account #: [id number] Procedure:                Upper GI endoscopy Indications:              Upper abdominal symptoms that persist despite an                            appropriate trial of therapy, Epigastric abdominal                            pain, Gastro-esophageal reflux disease, Failure to                            thrive Medicines:                Monitored Anesthesia Care Procedure:                Pre-Anesthesia Assessment:                           - Prior to the procedure, a History and Physical                            was performed, and patient medications and                            allergies were reviewed. The patient's tolerance of                            previous anesthesia was also reviewed. The risks                            and benefits of the procedure and the sedation                            options and risks were discussed with the patient.                            All questions were answered, and informed consent                            was obtained. Prior Anticoagulants: The patient has                            taken no previous anticoagulant or antiplatelet                            agents. ASA Grade Assessment: II - A patient with                            mild systemic disease. After reviewing the risks  and benefits, the patient was deemed in                            satisfactory condition to undergo the procedure.                           After obtaining informed consent, the endoscope was                            passed under direct vision. Throughout the                            procedure, the patient's blood pressure, pulse, and                            oxygen saturations were monitored  continuously. The                            Model GIF-HQ190 [id number]) scope was introduced                            through the mouth, and advanced to the second part                            of duodenum. The upper GI endoscopy was                            accomplished without difficulty. The patient                            tolerated the procedure well. Scope In: Scope Out: Findings:                 The esophagus was normal. Regular Z-line                           The stomach was normal.                           The duodenal bulb and second portion of the                            duodenum were normal. Biopsies for histology were                            taken with a cold forceps for evaluation of celiac                            disease. Complications:            No immediate complications. Estimated Blood Loss:     Estimated blood loss: none. Estimated blood loss                            was minimal. Impression:               -  Normal esophagus.                           - Normal stomach.                           - Normal duodenal bulb and second portion of the                            duodenum. Biopsied. Recommendation:           - Patient has a contact number available for                            emergencies. The signs and symptoms of potential                            delayed complications were discussed with the                            patient. Return to normal activities tomorrow.                            Written discharge instructions were provided to the                            patient.                           - Resume previous diet. Small meals. Avoid fiber                            and high fat diet                           - Continue present medications. Limit narcotics                           - Await pathology results.                           - No repeat upper endoscopy.                           - Return to GI office  PRN. Mauri Pole, MD 07/16/2016 10:47:16 AM This report has been signed electronically.

## 2016-07-16 NOTE — Progress Notes (Signed)
Pt. Has bruising and lacerations to right forearm she stated she fail a week ago and obtained the areas from fall,she also stated that her husband brought her a walker but she will not use it because she bumps into things with it. Pt. Refused to send cell phone out to spouse,she placed in her pocket. Note placed on chart that pt. Has phone in pocket.pt. Also informed me that she is not taking blood pressure medication at this time.

## 2016-07-17 ENCOUNTER — Telehealth: Payer: Self-pay | Admitting: *Deleted

## 2016-07-17 NOTE — Telephone Encounter (Signed)
  Follow up Call-  Call back number 07/16/2016 08/01/2015  Post procedure Call Back phone  # [phone]  Permission to leave phone message No Yes  comments no voicemail set up -  Some recent data might be hidden     Patient questions:  Do you have a fever, pain , or abdominal swelling? No. Pain Score  0 *  Have you tolerated food without any problems? Yes.    Have you been able to return to your normal activities? Yes.    Do you have any questions about your discharge instructions: Diet   No. Medications  No. Follow up visit  No.  Do you have questions or concerns about your Care? No.  Actions: * If pain score is 4 or above: No action needed, pain <4.

## 2016-07-22 ENCOUNTER — Other Ambulatory Visit (INDEPENDENT_AMBULATORY_CARE_PROVIDER_SITE_OTHER): Payer: Managed Care, Other (non HMO)

## 2016-07-22 ENCOUNTER — Ambulatory Visit (INDEPENDENT_AMBULATORY_CARE_PROVIDER_SITE_OTHER): Payer: Managed Care, Other (non HMO) | Admitting: Internal Medicine

## 2016-07-22 ENCOUNTER — Encounter: Payer: Self-pay | Admitting: Internal Medicine

## 2016-07-22 VITALS — BP 148/98 | HR 84 | Temp 98.6°F | Resp 16 | Ht 65.0 in | Wt 149.0 lb

## 2016-07-22 DIAGNOSIS — Z0001 Encounter for general adult medical examination with abnormal findings: Secondary | ICD-10-CM | POA: Diagnosis not present

## 2016-07-22 DIAGNOSIS — R200 Anesthesia of skin: Secondary | ICD-10-CM | POA: Diagnosis not present

## 2016-07-22 DIAGNOSIS — E559 Vitamin D deficiency, unspecified: Secondary | ICD-10-CM | POA: Diagnosis not present

## 2016-07-22 DIAGNOSIS — K2100 Gastro-esophageal reflux disease with esophagitis, without bleeding: Secondary | ICD-10-CM

## 2016-07-22 DIAGNOSIS — M797 Fibromyalgia: Secondary | ICD-10-CM | POA: Diagnosis not present

## 2016-07-22 DIAGNOSIS — I10 Essential (primary) hypertension: Secondary | ICD-10-CM

## 2016-07-22 DIAGNOSIS — R202 Paresthesia of skin: Secondary | ICD-10-CM

## 2016-07-22 DIAGNOSIS — Z1159 Encounter for screening for other viral diseases: Secondary | ICD-10-CM

## 2016-07-22 DIAGNOSIS — M48061 Spinal stenosis, lumbar region without neurogenic claudication: Secondary | ICD-10-CM | POA: Diagnosis not present

## 2016-07-22 DIAGNOSIS — Z0000 Encounter for general adult medical examination without abnormal findings: Secondary | ICD-10-CM

## 2016-07-22 DIAGNOSIS — G4700 Insomnia, unspecified: Secondary | ICD-10-CM

## 2016-07-22 DIAGNOSIS — F411 Generalized anxiety disorder: Secondary | ICD-10-CM

## 2016-07-22 LAB — VITAMIN B12: Vitamin B-12: 298 pg/mL (ref 211–911)

## 2016-07-22 LAB — MAGNESIUM: Magnesium: 2.3 mg/dL (ref 1.5–2.5)

## 2016-07-22 LAB — VITAMIN D 25 HYDROXY (VIT D DEFICIENCY, FRACTURES): VITD: 13.11 ng/mL — ABNORMAL LOW (ref 30.00–100.00)

## 2016-07-22 NOTE — Assessment & Plan Note (Signed)
Stopped prilosec Controlled with GERD diet

## 2016-07-22 NOTE — Progress Notes (Signed)
Subjective:    Patient ID: Yolanda [name], female    DOB: December 24, 1960, 56 y.o.   MRN: [id number]  HPI She is here for a physical exam.   Chronic back pain and neck pain: she did see pain management and the pain medications did not help.  She Is not longer seeing them.   Fibromyalgia:  She is taking the lyrica three times daily.  She has chronic pain. She is starting to exercise regularly.   Hypertension: She she went to pain management her BP was always 120/70's without medication.  She gets similar numbers numbers at home  She does monitor her blood pressure at home 3-4 times a day.    GERD;  She is complaint with a gerd diet.  She is no longer taking the prilosec daily.   She is frustrated by her weight.  She thinks she needs something for her weight.  She doe snot overeat.  She is exercising, but has started walking a trail that is 3 miles three times a week.    She is smoking 5 cigarettes a day.  She is trying to wean herself off completely.     Medications and allergies reviewed with patient and updated if appropriate.  Patient Active Problem List   Diagnosis Date Noted  . GERD (gastroesophageal reflux disease) 09/17/2015  . History of colonic polyps 09/17/2015  . Edema 09/17/2015  . Palpitations 07/08/2015  . Chest pain 07/08/2015  . Smoker 06/23/2015  . Fibromyalgia 05/24/2015  . Spinal stenosis of lumbar region 05/24/2015  . Colitis 05/24/2015  . Joint pain 05/24/2015  . Hyperglycemia 05/24/2015  . Essential hypertension, benign 05/24/2015  . Insomnia 05/24/2015  . Generalized anxiety disorder 05/24/2015  . Depression 05/24/2015    Current Outpatient Prescriptions on File Prior to Visit  Medication Sig Dispense Refill  . CALCIUM PO Take 1 tablet by mouth daily.    . cholecalciferol (VITAMIN D) 1000 UNITS tablet Take 1,000 Units by mouth daily.    Marland Kitchen CINNAMON PO Take 1 capsule by mouth daily.    . clonazePAM (KLONOPIN) 0.5 MG tablet Take 1 tablet (0.5 mg total)  by mouth 3 (three) times daily as needed. for anxiety 90 tablet 0  . Ginger, Zingiber officinalis, (GINGER PO) Take 1 tablet by mouth daily.    Marland Kitchen loperamide (IMODIUM A-D) 2 MG tablet Take 1 tablet (2 mg total) by mouth every 12 (twelve) hours as needed for diarrhea or loose stools. 30 tablet 3  . LYRICA 150 MG capsule TAKE 1 CAPSULE BY MOUTH THREE TIMES DAILY. 90 capsule 0  . Multiple Vitamins-Minerals (MULTI ADULT GUMMIES PO) Take 2 each by mouth daily.    Marland Kitchen omeprazole (PRILOSEC) 20 MG capsule Take 1 capsule (20 mg total) by mouth daily. 90 capsule 3  . ondansetron (ZOFRAN) 4 MG tablet Take one tablet every 12 hours as needed for nausea. 60 tablet 0  . OVER THE COUNTER MEDICATION Take 1 tablet by mouth daily. Med Name: TUMERIC    . QUEtiapine (SEROQUEL) 100 MG tablet TAKE (1) TABLET BY MOUTH AT BEDTIME. 90 tablet 0  . amLODipine (NORVASC) 10 MG tablet Take 1 tablet (10 mg total) by mouth daily. (Patient not taking: Reported on 07/22/2016) 90 tablet 3  . buPROPion (WELLBUTRIN XL) 150 MG 24 hr tablet Take 1 tablet (150 mg total) by mouth daily. (Patient not taking: Reported on 07/22/2016) 30 tablet 5  . dicyclomine (BENTYL) 10 MG capsule Take 1 capsule (10 mg total) by  mouth 3 (three) times daily before meals. (Patient not taking: Reported on 07/22/2016) 90 capsule 3   No current facility-administered medications on file prior to visit.     Past Medical History:  Diagnosis Date  . Abdominal hernia   . Allergy    shell fish  . Anxiety   . Arthritis   . Atypical chest pain   . Bronchitis   . Colitis   . Esophagitis    egd 08-01-15 w. K Nandigam  . Fibromyalgia   . Gallstones   . GERD (gastroesophageal reflux disease)   . HTN (hypertension)   . Hx of adenomatous colonic polyps   . Neuromuscular disorder (HCC)    fibromyalgia, hiatal hernia  . Palpitations   . Polyp of duodenum   . Renal disorder    kidney stones  . 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  Thyroid disease    hypothryroid    Past Surgical History:  Procedure Laterality Date  . CHOLECYSTECTOMY    . COLONOSCOPY    . POLYPECTOMY    . TUBAL LIGATION    . UPPER GASTROINTESTINAL ENDOSCOPY      Social History   Social History  . Marital status: Married    Spouse name: N/A  . Number of children: 0  . Years of education: N/A   Social History Main Topics  . Smoking status: Current Every Day Smoker    Packs/day: 0.50    Types: Cigarettes  . Smokeless tobacco: Never Used     Comment: 5 a day   . Alcohol use No  . Drug use: No  . Sexual activity: Yes   Other Topics Concern  . Not on file   Social History Narrative  . No narrative on file    Family History  Problem Relation Age of Onset  . Prostate cancer Father   . Heart disease Father   . Colon cancer Paternal Aunt   . Colon cancer Paternal Aunt   . Crohn's disease Paternal Aunt   . Multiple sclerosis Paternal Aunt   . Fibromyalgia Paternal Aunt   . Multiple sclerosis Paternal Aunt   . Multiple sclerosis Sister   . Colon polyps Neg Hx   . Esophageal cancer Neg Hx   . Rectal cancer Neg Hx   . Stomach cancer Neg Hx     Review of Systems  Constitutional: Negative for chills and fever.  Eyes: Negative for visual disturbance.  Respiratory: Positive for cough (smoking), shortness of breath (with moderate exertion) and wheezing.   Cardiovascular: Positive for leg swelling (mild - after walking, from lyrica). Negative for chest pain and palpitations.  Gastrointestinal: Positive for diarrhea. Negative for abdominal pain, constipation and nausea.  Genitourinary: Negative for dysuria and hematuria.  Musculoskeletal: Positive for back pain, myalgias and neck pain.  Skin: Negative for color change and rash.  Neurological: Positive for headaches. Negative for light-headedness.  Psychiatric/Behavioral: Positive for dysphoric mood and sleep disturbance (controlled). The patient is nervous/anxious.          Objective:   Vitals:   07/22/16 1409  BP: (!) 148/98  Pulse: 84  Resp: 16  Temp: 98.6 F (37 C)   Filed Weights   07/22/16 1409  Weight: 149 lb (67.6 kg)   Body mass index is 24.79 kg/m.  Wt Readings from Last 3 Encounters:  07/22/16 149 lb (67.6 kg)  07/16/16 150 lb (68 kg)  07/02/16 150 lb (68  kg)     Physical Exam Constitutional: She appears well-developed and well-nourished. No distress.  HENT:  Head: Normocephalic and atraumatic.  Right Ear: External ear normal. Normal ear canal and TM Left Ear: External ear normal.  Normal ear canal and TM Mouth/Throat: Oropharynx is clear and moist.  Eyes: Conjunctivae and EOM are normal.  Neck: Neck supple. No tracheal deviation present. No thyromegaly present.  No carotid bruit  Cardiovascular: Normal rate, regular rhythm and normal heart sounds.   No murmur heard.  No edema. Pulmonary/Chest: Effort normal and breath sounds normal. No respiratory distress. She has no wheezes. She has no rales.  Breast: deferred to Gyn Abdominal: Soft. She exhibits no distension. There is no tenderness.  Lymphadenopathy: She has no cervical adenopathy.  Skin: Skin is warm and dry. She is not diaphoretic.  Psychiatric: She has a normal mood and affect. Her behavior is normal.         Assessment & Plan:   Physical exam: Screening blood work  reviewed Immunizations  - deferred flu vaccine Colonoscopy   Up to date  Mammogram  - needs to schedule Gyn - she is due - given information to schedule Eye exams   - due, will schedule EKG -last done one year ago Exercise - starting to walking - stressed importance to continue regular walking Weight - discussed weight loss Skin  - no concerns Substance abuse - working on smoking cessation  See Problem List for Assessment and Plan of chronic medical problems.  FU in 6 months

## 2016-07-22 NOTE — Progress Notes (Signed)
Pre visit review using our clinic review tool, if applicable. No additional management support is needed unless otherwise documented below in the visit note. 

## 2016-07-22 NOTE — Patient Instructions (Addendum)
Make an appointment for gyn:  [address] [place], [place] [address] [phone]   Test(s) ordered today. Your results will be released to [facility] (or called to you) after review, usually within 72hours after test completion. If any changes need to be made, you will be notified at that same time.  All other Health Maintenance issues reviewed.   All recommended immunizations and age-appropriate screenings are up-to-date or discussed.  No immunizations administered today.   Medications reviewed and updated.  No changes recommended at this time.  Your prescription(s) have been submitted to your pharmacy. Please take as directed and contact our office if you believe you are having problem(s) with the medication(s).  Please followup in 6 months   Health Maintenance, Female Introduction Adopting a healthy lifestyle and getting preventive care can go a long way to promote health and wellness. Talk with your health care provider about what schedule of regular examinations is right for you. This is a good chance for you to check in with your provider about disease prevention and staying healthy. In between checkups, there are plenty of things you can do on your own. Experts have done a lot of research about which lifestyle changes and preventive measures are most likely to keep you healthy. Ask your health care provider for more information. Weight and diet Eat a healthy diet  Be sure to include plenty of vegetables, fruits, low-fat dairy products, and lean protein.  Do not eat a lot of foods high in solid fats, added sugars, or salt.  Get regular exercise. This is one of the most important things you can do for your health.  Most adults should exercise for at least 150 minutes each week. The exercise should increase your heart rate and make you sweat (moderate-intensity exercise).  Most adults should also do strengthening exercises at least twice a  week. This is in addition to the moderate-intensity exercise. Maintain a healthy weight  Body mass index (BMI) is a measurement that can be used to identify possible weight problems. It estimates body fat based on height and weight. Your health care provider can help determine your BMI and help you achieve or maintain a healthy weight.  For females 18 years of age and older:  A BMI below 18.5 is considered underweight.  A BMI of 18.5 to 24.9 is normal.  A BMI of 25 to 29.9 is considered overweight.  A BMI of 30 and above is considered obese. Watch levels of cholesterol and blood lipids  You should start having your blood tested for lipids and cholesterol at 57 years of age, then have this test every 5 years.  You may need to have your cholesterol levels checked more often if:  Your lipid or cholesterol levels are high.  You are older than 57 years of age.  You are at high risk for heart disease. Cancer screening Lung Cancer  Lung cancer screening is recommended for adults 56-34 years old who are at high risk for lung cancer because of a history of smoking.  A yearly low-dose CT scan of the lungs is recommended for people who:  Currently smoke.  Have quit within the past 15 years.  Have at least a 30-pack-year history of smoking. A pack year is smoking an average of one pack of cigarettes a day for 1 year.  Yearly screening should continue until it has been 15 years since you quit.  Yearly screening should stop if you develop a  health problem that would prevent you from having lung cancer treatment. Breast Cancer  Practice breast self-awareness. This means understanding how your breasts normally appear and feel.  It also means doing regular breast self-exams. Let your health care provider know about any changes, no matter how small.  If you are in your 20s or 30s, you should have a clinical breast exam (CBE) by a health care provider every 1-3 years as part of a regular  health exam.  If you are 44 or older, have a CBE every year. Also consider having a breast X-ray (mammogram) every year.  If you have a family history of breast cancer, talk to your health care provider about genetic screening.  If you are at high risk for breast cancer, talk to your health care provider about having an MRI and a mammogram every year.  Breast cancer gene (BRCA) assessment is recommended for women who have family members with BRCA-related cancers. BRCA-related cancers include:  Breast.  Ovarian.  Tubal.  Peritoneal cancers.  Results of the assessment will determine the need for genetic counseling and BRCA1 and BRCA2 testing. Cervical Cancer  Your health care provider may recommend that you be screened regularly for cancer of the pelvic organs (ovaries, uterus, and vagina). This screening involves a pelvic examination, including checking for microscopic changes to the surface of your cervix (Pap test). You may be encouraged to have this screening done every 3 years, beginning at age 66.  For women ages 65-65, health care providers may recommend pelvic exams and Pap testing every 3 years, or they may recommend the Pap and pelvic exam, combined with testing for human papilloma virus (HPV), every 5 years. Some types of HPV increase your risk of cervical cancer. Testing for HPV may also be done on women of any age with unclear Pap test results.  Other health care providers may not recommend any screening for nonpregnant women who are considered low risk for pelvic cancer and who do not have symptoms. Ask your health care provider if a screening pelvic exam is right for you.  If you have had past treatment for cervical cancer or a condition that could lead to cancer, you need Pap tests and screening for cancer for at least 20 years after your treatment. If Pap tests have been discontinued, your risk factors (such as having a new sexual partner) need to be reassessed to determine  if screening should resume. Some women have medical problems that increase the chance of getting cervical cancer. In these cases, your health care provider may recommend more frequent screening and Pap tests. Colorectal Cancer  This type of cancer can be detected and often prevented.  Routine colorectal cancer screening usually begins at 57 years of age and continues through 57 years of age.  Your health care provider may recommend screening at an earlier age if you have risk factors for colon cancer.  Your health care provider may also recommend using home test kits to check for hidden blood in the stool.  A small camera at the end of a tube can be used to examine your colon directly (sigmoidoscopy or colonoscopy). This is done to check for the earliest forms of colorectal cancer.  Routine screening usually begins at age 88.  Direct examination of the colon should be repeated every 5-10 years through 57 years of age. However, you may need to be screened more often if early forms of precancerous polyps or small growths are found. Skin Cancer  Check  your skin from head to toe regularly.  Tell your health care provider about any new moles or changes in moles, especially if there is a change in a mole's shape or color.  Also tell your health care provider if you have a mole that is larger than the size of a pencil eraser.  Always use sunscreen. Apply sunscreen liberally and repeatedly throughout the day.  Protect yourself by wearing long sleeves, pants, a wide-brimmed hat, and sunglasses whenever you are outside. Heart disease, diabetes, and high blood pressure  High blood pressure causes heart disease and increases the risk of stroke. High blood pressure is more likely to develop in:  People who have blood pressure in the high end of the normal range (130-139/85-89 mm Hg).  People who are overweight or obese.  People who are African American.  If you are 93-64 years of age, have  your blood pressure checked every 3-5 years. If you are 20 years of age or older, have your blood pressure checked every year. You should have your blood pressure measured twice-once when you are at a hospital or clinic, and once when you are not at a hospital or clinic. Record the average of the two measurements. To check your blood pressure when you are not at a hospital or clinic, you can use:  An automated blood pressure machine at a pharmacy.  A home blood pressure monitor.  If you are between 37 years and 56 years old, ask your health care provider if you should take aspirin to prevent strokes.  Have regular diabetes screenings. This involves taking a blood sample to check your fasting blood sugar level.  If you are at a normal weight and have a low risk for diabetes, have this test once every three years after 57 years of age.  If you are overweight and have a high risk for diabetes, consider being tested at a younger age or more often. Preventing infection Hepatitis B  If you have a higher risk for hepatitis B, you should be screened for this virus. You are considered at high risk for hepatitis B if:  You were born in a country where hepatitis B is common. Ask your health care provider which countries are considered high risk.  Your parents were born in a high-risk country, and you have not been immunized against hepatitis B (hepatitis B vaccine).  You have HIV or AIDS.  You use needles to inject street drugs.  You live with someone who has hepatitis B.  You have had sex with someone who has hepatitis B.  You get hemodialysis treatment.  You take certain medicines for conditions, including cancer, organ transplantation, and autoimmune conditions. Hepatitis C  Blood testing is recommended for:  Everyone born from [birth year] through 1966.  Anyone with known risk factors for hepatitis C. Sexually transmitted infections (STIs)  You should be screened for sexually transmitted  infections (STIs) including gonorrhea and chlamydia if:  You are sexually active and are younger than 57 years of age.  You are older than 57 years of age and your health care provider tells you that you are at risk for this type of infection.  Your sexual activity has changed since you were last screened and you are at an increased risk for chlamydia or gonorrhea. Ask your health care provider if you are at risk.  If you do not have HIV, but are at risk, it may be recommended that you take a prescription medicine daily to prevent  HIV infection. This is called pre-exposure prophylaxis (PrEP). You are considered at risk if:  You are sexually active and do not regularly use condoms or know the HIV status of your partner(s).  You take drugs by injection.  You are sexually active with a partner who has HIV. Talk with your health care provider about whether you are at high risk of being infected with HIV. If you choose to begin PrEP, you should first be tested for HIV. You should then be tested every 3 months for as long as you are taking PrEP. Pregnancy  If you are premenopausal and you may become pregnant, ask your health care provider about preconception counseling.  If you may become pregnant, take 400 to 800 micrograms (mcg) of folic acid every day.  If you want to prevent pregnancy, talk to your health care provider about birth control (contraception). Osteoporosis and menopause  Osteoporosis is a disease in which the bones lose minerals and strength with aging. This can result in serious bone fractures. Your risk for osteoporosis can be identified using a bone density scan.  If you are 71 years of age or older, or if you are at risk for osteoporosis and fractures, ask your health care provider if you should be screened.  Ask your health care provider whether you should take a calcium or vitamin D supplement to lower your risk for osteoporosis.  Menopause may have certain physical  symptoms and risks.  Hormone replacement therapy may reduce some of these symptoms and risks. Talk to your health care provider about whether hormone replacement therapy is right for you. Follow these instructions at home:  Schedule regular health, dental, and eye exams.  Stay current with your immunizations.  Do not use any tobacco products including cigarettes, chewing tobacco, or electronic cigarettes.  If you are pregnant, do not drink alcohol.  If you are breastfeeding, limit how much and how often you drink alcohol.  Limit alcohol intake to no more than 1 drink per day for nonpregnant women. One drink equals 12 ounces of beer, 5 ounces of wine, or 1 ounces of hard liquor.  Do not use street drugs.  Do not share needles.  Ask your health care provider for help if you need support or information about quitting drugs.  Tell your health care provider if you often feel depressed.  Tell your health care provider if you have ever been abused or do not feel safe at home. This information is not intended to replace advice given to you by your health care provider. Make sure you discuss any questions you have with your health care provider. Document Released: 01/19/2011 Document Revised: 12/12/2015 Document Reviewed: 04/09/2015  2017 Elsevier

## 2016-07-22 NOTE — Assessment & Plan Note (Signed)
Taking lyrica, which helps Taking tumeric, ginger

## 2016-07-22 NOTE — Assessment & Plan Note (Signed)
BP well controlled at home  Will hold medication Take as needed for now Continue to monitor at home daily

## 2016-07-22 NOTE — Assessment & Plan Note (Addendum)
Taking clonazepam three times daily Still having anxiety  Clonazepam does not seem to be as effective Will continue current dose - will not increase

## 2016-07-22 NOTE — Assessment & Plan Note (Signed)
Seroquel working well Continue current dose

## 2016-07-22 NOTE — Assessment & Plan Note (Signed)
Check level 

## 2016-07-22 NOTE — Assessment & Plan Note (Signed)
Saw pain management for 8 months ( Dr Andree Elk) - had injections Tried several oral pain medications - only took each one for two weeks - there was no improvement of pain No longer seeing pain mangement

## 2016-07-22 NOTE — Assessment & Plan Note (Signed)
Check b12 level  Likely from fibromyalgia

## 2016-07-23 ENCOUNTER — Other Ambulatory Visit: Payer: Self-pay | Admitting: Internal Medicine

## 2016-07-23 LAB — HEPATITIS C ANTIBODY: HCV Ab: NEGATIVE

## 2016-07-23 MED ORDER — VITAMIN D (ERGOCALCIFEROL) 1.25 MG (50000 UNIT) PO CAPS: 50000.0000 [IU] | ORAL_CAPSULE | ORAL | 0 refills | Status: AC

## 2016-07-24 ENCOUNTER — Encounter: Payer: Self-pay | Admitting: Emergency Medicine

## 2016-07-28 ENCOUNTER — Encounter: Payer: Self-pay | Admitting: Gastroenterology

## 2016-07-31 ENCOUNTER — Other Ambulatory Visit: Payer: Self-pay | Admitting: Internal Medicine

## 2016-08-01 MED ORDER — CLONAZEPAM 0.5 MG PO TABS: 0.5000 mg | ORAL_TABLET | Freq: Three times a day (TID) | ORAL | 0 refills | Status: DC | PRN

## 2016-08-01 NOTE — Telephone Encounter (Signed)
[place] controlled substance database checked.  Ok to fill medication. rx printed 

## 2016-08-03 NOTE — Telephone Encounter (Signed)
RX faxed to [facility] 

## 2016-08-14 ENCOUNTER — Other Ambulatory Visit: Payer: Self-pay | Admitting: Internal Medicine

## 2016-08-14 NOTE — Telephone Encounter (Signed)
Faxed script back to [facility].../lmb 

## 2016-08-14 NOTE — Telephone Encounter (Signed)
Pt left msg on triage wanting refill on her Lyrica. MD out of office pls advise...Johny Chess

## 2016-09-04 ENCOUNTER — Other Ambulatory Visit: Payer: Self-pay | Admitting: Internal Medicine

## 2016-09-11 ENCOUNTER — Other Ambulatory Visit: Payer: Self-pay | Admitting: Internal Medicine

## 2016-09-12 ENCOUNTER — Other Ambulatory Visit: Payer: Self-pay | Admitting: Internal Medicine

## 2016-09-29 ENCOUNTER — Other Ambulatory Visit: Payer: Self-pay | Admitting: Internal Medicine

## 2016-09-30 NOTE — Telephone Encounter (Signed)
RX faxed to [facility] 

## 2016-10-01 MED ORDER — CLONAZEPAM 0.5 MG PO TABS: 0.5000 mg | ORAL_TABLET | Freq: Three times a day (TID) | ORAL | 0 refills | Status: AC | PRN

## 2016-10-01 NOTE — Telephone Encounter (Signed)
called refill into [facility] sopke w/Kelly gave MD approval.../lmb

## 2016-10-07 ENCOUNTER — Other Ambulatory Visit: Payer: Self-pay | Admitting: Internal Medicine

## 2016-10-08 MED ORDER — PREGABALIN 150 MG PO CAPS: 150.0000 mg | ORAL_CAPSULE | Freq: Three times a day (TID) | ORAL | 0 refills | Status: DC

## 2016-11-02 ENCOUNTER — Other Ambulatory Visit: Payer: Self-pay | Admitting: Internal Medicine

## 2016-11-04 ENCOUNTER — Other Ambulatory Visit: Payer: Self-pay | Admitting: Internal Medicine

## 2016-11-04 MED ORDER — PREGABALIN 150 MG PO CAPS: 150.0000 mg | ORAL_CAPSULE | Freq: Three times a day (TID) | ORAL | 0 refills | Status: DC

## 2016-11-04 NOTE — Telephone Encounter (Signed)
[place] controlled substance database checked.  Ok to fill medication.  

## 2016-11-06 NOTE — Telephone Encounter (Signed)
Lyrica RX was faxed to [facility] on 11/05/2016

## 2016-12-01 ENCOUNTER — Other Ambulatory Visit: Payer: Self-pay | Admitting: Internal Medicine

## 2016-12-03 ENCOUNTER — Telehealth: Payer: Self-pay | Admitting: *Deleted

## 2016-12-03 MED ORDER — CLONAZEPAM 0.5 MG PO TABS: ORAL_TABLET | ORAL | 0 refills | Status: AC

## 2016-12-03 MED ORDER — PREGABALIN 150 MG PO CAPS: 150.0000 mg | ORAL_CAPSULE | Freq: Three times a day (TID) | ORAL | 0 refills | Status: DC

## 2016-12-03 NOTE — Telephone Encounter (Signed)
[place] controlled substance database checked.  Ok to fill medication.  

## 2016-12-03 NOTE — Telephone Encounter (Signed)
Faxed script to [facility].../lmb 

## 2016-12-03 NOTE — Telephone Encounter (Signed)
Rec'd fax pt requesting refills on Lyrica and clonazepam. Last filled on 11/05/16 pls advise...Yolanda [name]

## 2016-12-17 ENCOUNTER — Other Ambulatory Visit: Payer: Self-pay | Admitting: Internal Medicine

## 2016-12-30 ENCOUNTER — Other Ambulatory Visit: Payer: Self-pay | Admitting: Internal Medicine

## 2016-12-30 NOTE — Telephone Encounter (Signed)
Called refills into [facility] spoke w/April gave MD approval.../lmb

## 2016-12-30 NOTE — Telephone Encounter (Signed)
[place] controlled substance database checked.  Ok to fill medication.  

## 2017-01-19 ENCOUNTER — Ambulatory Visit: Payer: Managed Care, Other (non HMO) | Admitting: Internal Medicine

## 2017-01-28 ENCOUNTER — Other Ambulatory Visit: Payer: Self-pay | Admitting: Internal Medicine

## 2017-01-29 NOTE — Telephone Encounter (Signed)
[place] controlled substance database checked.  Ok to fill medication.  She needs to come to the 8/18 appt for further refills.

## 2017-01-29 NOTE — Telephone Encounter (Signed)
RX faxed to [facility] 

## 2017-01-29 NOTE — Telephone Encounter (Signed)
pt no showed for last appt, has another appt scheduled for 8/01, please advise.

## 2017-02-17 ENCOUNTER — Ambulatory Visit: Payer: Self-pay | Admitting: Internal Medicine

## 2017-03-10 ENCOUNTER — Encounter (HOSPITAL_COMMUNITY): Payer: Self-pay | Admitting: Emergency Medicine

## 2017-03-10 ENCOUNTER — Emergency Department (HOSPITAL_COMMUNITY): Payer: Managed Care, Other (non HMO)

## 2017-03-10 ENCOUNTER — Emergency Department (HOSPITAL_COMMUNITY)
Admission: EM | Admit: 2017-03-10 | Discharge: 2017-03-10 | Disposition: A | Discharge status: Home / Self Care | Payer: Managed Care, Other (non HMO) | Attending: Emergency Medicine | Admitting: Emergency Medicine

## 2017-03-10 DIAGNOSIS — Z79899 Other long term (current) drug therapy: Secondary | ICD-10-CM | POA: Diagnosis not present

## 2017-03-10 DIAGNOSIS — Y999 Unspecified external cause status: Secondary | ICD-10-CM | POA: Insufficient documentation

## 2017-03-10 DIAGNOSIS — F17210 Nicotine dependence, cigarettes, uncomplicated: Secondary | ICD-10-CM | POA: Insufficient documentation

## 2017-03-10 DIAGNOSIS — W010XXA Fall on same level from slipping, tripping and stumbling without subsequent striking against object, initial encounter: Secondary | ICD-10-CM | POA: Diagnosis not present

## 2017-03-10 DIAGNOSIS — I10 Essential (primary) hypertension: Secondary | ICD-10-CM | POA: Diagnosis not present

## 2017-03-10 DIAGNOSIS — E039 Hypothyroidism, unspecified: Secondary | ICD-10-CM | POA: Insufficient documentation

## 2017-03-10 DIAGNOSIS — Y929 Unspecified place or not applicable: Secondary | ICD-10-CM | POA: Insufficient documentation

## 2017-03-10 DIAGNOSIS — Y939 Activity, unspecified: Secondary | ICD-10-CM | POA: Diagnosis not present

## 2017-03-10 DIAGNOSIS — S52501A Unspecified fracture of the lower end of right radius, initial encounter for closed fracture: Secondary | ICD-10-CM | POA: Insufficient documentation

## 2017-03-10 DIAGNOSIS — S6991XA Unspecified injury of right wrist, hand and finger(s), initial encounter: Secondary | ICD-10-CM | POA: Diagnosis present

## 2017-03-10 MED ORDER — ONDANSETRON HCL 4 MG PO TABS
4.0000 mg | ORAL_TABLET | Freq: Once | ORAL | Status: AC
  Administered 2017-03-10: 4 mg via ORAL
  Filled 2017-03-10: qty 1

## 2017-03-10 MED ORDER — HYDROCODONE-ACETAMINOPHEN 5-325 MG PO TABS: 1.0000 | ORAL_TABLET | ORAL | 0 refills | Status: AC | PRN

## 2017-03-10 MED ORDER — HYDROCODONE-ACETAMINOPHEN 5-325 MG PO TABS
2.0000 | ORAL_TABLET | Freq: Once | ORAL | Status: AC
  Administered 2017-03-10: 2 via ORAL
  Filled 2017-03-10: qty 2

## 2017-03-10 NOTE — ED Provider Notes (Signed)
[facility] DEPT Provider Note   CSN: [id number] Arrival date & time: 03/10/17  1231     History   Chief Complaint Chief Complaint  Patient presents with  . Arm Injury    HPI Yolanda [name] is a 56 y.o. female.  Patient is a 56 year old female who presents to the emergency department with the complaint of right wrist pain.  Patient states she sustained a fall on Monday, August 20 at which time she fell on outstretched hand. She brought a splint to put on and this helped the pain along with Tylenol. The patient unfortunately fell again yesterday while hiking. She rolled down an embankment and again injured the right wrist and arm. The patient states the pain is not responding to the splint, to ice, or to the Tylenol. She presents now for assistance with this issue.   The history is provided by the patient.  Arm Injury      Past Medical History:  Diagnosis Date  . Abdominal hernia   . Allergy    shell fish  . Anxiety   . Arthritis   . Atypical chest pain   . Bronchitis   . Colitis   . Esophagitis    egd 08-01-15 w. K Nandigam  . Fibromyalgia   . Gallstones   . GERD (gastroesophageal reflux disease)   . HTN (hypertension)   . Hx of adenomatous colonic polyps   . Neuromuscular disorder (HCC)    fibromyalgia, hiatal hernia  . Palpitations   . Polyp of duodenum   . Renal disorder    kidney stones  . 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 Thyroid disease    hypothryroid    Patient Active Problem List   Diagnosis Date Noted  . Vitamin D deficiency 07/22/2016  . Numbness and tingling of both feet 07/22/2016  . GERD (gastroesophageal reflux disease) 09/17/2015  . History of colonic polyps 09/17/2015  . Edema 09/17/2015  . Palpitations 07/08/2015  . Chest pain 07/08/2015  . Smoker 06/23/2015  . Fibromyalgia 05/24/2015  . Spinal stenosis of lumbar region 05/24/2015  . Colitis 05/24/2015  . Joint pain 05/24/2015  . Hyperglycemia  05/24/2015  . Essential hypertension, benign 05/24/2015  . Insomnia 05/24/2015  . Generalized anxiety disorder 05/24/2015  . Depression 05/24/2015    Past Surgical History:  Procedure Laterality Date  . CHOLECYSTECTOMY    . COLONOSCOPY    . POLYPECTOMY    . TUBAL LIGATION    . UPPER GASTROINTESTINAL ENDOSCOPY      OB History    No data available       Home Medications    Prior to Admission medications   Medication Sig Start Date End Date Taking? Authorizing Provider  amLODipine (NORVASC) 10 MG tablet Take 1 tablet (10 mg total) by mouth daily. Patient not taking: Reported on 07/22/2016 12/19/15   Binnie Rail, MD  CALCIUM PO Take 1 tablet by mouth daily.    [provider]  cholecalciferol (VITAMIN D) 1000 UNITS tablet Take 1,000 Units by mouth daily.    [provider]  CINNAMON PO Take 1 capsule by mouth daily.    [provider]  clonazePAM (KLONOPIN) 0.5 MG tablet TAKE 1 TABLET BY MOUTH THREE TIMES DAILY. MUST LAST 30 DAYS. 09/30/16   Binnie Rail, MD  clonazePAM (KLONOPIN) 0.5 MG tablet Take 1 tablet (0.5 mg total) by mouth 3 (three) times daily as needed. for anxiety  10/01/16   Burns, Claudina Lick, MD  clonazePAM (KLONOPIN) 0.5 MG tablet TAKE 1 TABLET BY MOUTH THREE TIMES DAILY. MUST LAST 30 DAYS. 12/03/16   Burns, Claudina Lick, MD  clonazePAM (KLONOPIN) 0.5 MG tablet TAKE 1 TABLET BY MOUTH THREE TIMES DAILY. MUST LAST 30 DAYS. 12/30/16   Burns, Claudina Lick, MD  clonazePAM (KLONOPIN) 0.5 MG tablet TAKE 1 TABLET BY MOUTH THREE TIMES DAILY. MUST LAST 30 DAYS. 01/29/17   Binnie Rail, MD  Ginger, Zingiber officinalis, (GINGER PO) Take 1 tablet by mouth daily.    [provider]  loperamide (IMODIUM A-D) 2 MG tablet Take 1 tablet (2 mg total) by mouth every 12 (twelve) hours as needed for diarrhea or loose stools. 07/16/16   Nandigam, Venia Minks, MD  LYRICA 150 MG capsule TAKE 1 CAPSULE BY MOUTH THREE TIMES DAILY. 01/29/17   Binnie Rail, MD  Multiple  Vitamins-Minerals (MULTI ADULT GUMMIES PO) Take 2 each by mouth daily.    [provider]  ondansetron (ZOFRAN) 4 MG tablet Take one tablet every 12 hours as needed for nausea. 07/16/16   Mauri Pole, MD  OVER THE COUNTER MEDICATION Take 1 tablet by mouth daily. Med Name: [facility]    [provider]  QUEtiapine (SEROQUEL) 100 MG tablet TAKE TABLET BY MOUTH AT BEDTIME. 12/17/16   Binnie Rail, MD    Family History Family History  Problem Relation Age of Onset  . Prostate cancer Father   . Heart disease Father   . Colon cancer Paternal Aunt   . Colon cancer Paternal Aunt   . Crohn's disease Paternal Aunt   . Multiple sclerosis Paternal Aunt   . Fibromyalgia Paternal Aunt   . Multiple sclerosis Paternal Aunt   . Multiple sclerosis Sister   . Colon polyps Neg Hx   . Esophageal cancer Neg Hx   . Rectal cancer Neg Hx   . Stomach cancer Neg Hx     Social History Social History  Substance Use Topics  . Smoking status: Current Every Day Smoker    Packs/day: 0.50    Types: Cigarettes  . Smokeless tobacco: Never Used     Comment: 5 a day   . Alcohol use No     Allergies   Iodine; Shellfish allergy; and Nsaids   Review of Systems Review of Systems  Constitutional: Negative for activity change.       All ROS Neg except as noted in HPI  HENT: Negative for nosebleeds.   Eyes: Negative for photophobia and discharge.  Respiratory: Negative for cough, shortness of breath and wheezing.   Cardiovascular: Negative for chest pain and palpitations.  Gastrointestinal: Negative for abdominal pain and blood in stool.  Genitourinary: Negative for dysuria, frequency and hematuria.  Musculoskeletal: Positive for arthralgias. Negative for back pain and neck pain.  Skin: Negative.   Neurological: Negative for dizziness, seizures and speech difficulty.  Psychiatric/Behavioral: Negative for confusion and hallucinations. The patient is nervous/anxious.      Physical  Exam Updated Vital Signs BP (!) 155/88 (BP Location: Right Arm)   Pulse 83   Temp 98.3 F (36.8 C) (Oral)   Resp 18   Ht 5\' 4"  (1.626 m)   Wt 54.4 kg (120 lb)   SpO2 100%   BMI 20.60 kg/m   Physical Exam  Constitutional: She is oriented to person, place, and time. She appears well-developed and well-nourished.  Non-toxic appearance.  HENT:  Head: Normocephalic.  Right Ear: Tympanic membrane and  external ear normal.  Left Ear: Tympanic membrane and external ear normal.  Eyes: Pupils are equal, round, and reactive to light. EOM and lids are normal.  Neck: Normal range of motion. Neck supple. Carotid bruit is not present.  Cardiovascular: Normal rate, regular rhythm, normal heart sounds, intact distal pulses and normal pulses.   Pulmonary/Chest: Breath sounds normal. No respiratory distress.  Abdominal: Soft. Bowel sounds are normal. There is no tenderness. There is no guarding.  Musculoskeletal: Normal range of motion.  There is good range of motion of the right elbow and shoulder. There is pain with attempted range of motion of the right wrist. There is increased pain with pronation supination type exercises. There is some bruising at the palmar surface of the right wrist. Capillary refill is less than 2 seconds. Radial pulses 2+.  Lymphadenopathy:       Head (right side): No submandibular adenopathy present.       Head (left side): No submandibular adenopathy present.    She has no cervical adenopathy.  Neurological: She is alert and oriented to person, place, and time. She has normal strength. No cranial nerve deficit or sensory deficit.  Skin: Skin is warm and dry.  Psychiatric: She has a normal mood and affect. Her speech is normal.  Nursing note and vitals reviewed.    ED Treatments / Results  Labs (all labs ordered are listed, but only abnormal results are displayed) Labs Reviewed - No data to display  EKG  EKG Interpretation None       Radiology Dg Forearm  Right  Result Date: 03/10/2017 CLINICAL DATA:  Right forearm pain EXAM: RIGHT FOREARM - 2 VIEW COMPARISON:  None. FINDINGS: There is no evidence of fracture or other focal bone lesions. There are mild hypertrophic changes around the distal radius. Soft tissues are unremarkable. IMPRESSION: 1.  No acute osseous injury of the right forearm. 2. If there is persistent clinical concern regarding right wrist pain, recommend further evaluation with a dedicated wrist x-ray. Electronically Signed   By: Kathreen Devoid   On: 03/10/2017 13:08   Dg Wrist Complete Right  Result Date: 03/10/2017 CLINICAL DATA:  Status post fall.  Right arm pain.  Wrist pain. EXAM: RIGHT WRIST - COMPLETE 3+ VIEW COMPARISON:  None. FINDINGS: Cortical regularity along the ulnar aspect of the distal radial metaphysis with a subtle transverse lucency concerning for a nondisplaced fracture. No other fracture or dislocation. 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 No aggressive osseous lesion. IMPRESSION: 1. Cortical regularity along the ulnar aspect of the distal radial metaphysis with a subtle transverse lucency concerning for a nondisplaced fracture. Electronically Signed   By: Kathreen Devoid   On: 03/10/2017 13:44    Procedures FRACTURE CARE. Marland KitchenSplint Application Date/Time: 01/14/3661 3:33 PM Performed by: Lily Kocher Authorized by: Lily Kocher   Consent:    Consent obtained:  Verbal   Consent given by:  Patient   Risks discussed:  Pain and swelling Pre-procedure details:    Sensation:  Normal Procedure details:    Laterality:  Right   Location:  Wrist   Wrist:  R wrist   Splint type: Velcro wrist-forearm.   Supplies:  Prefabricated splint Post-procedure details:    Pain:  Improved   Sensation:  Normal   Skin color:  Pink   Patient tolerance of procedure:  Tolerated well, no immediate complications   (including critical care time)  Medications Ordered in ED Medications    HYDROcodone-acetaminophen (NORCO/VICODIN)  5-325 MG per tablet 2 tablet (2 tablets Oral Given 03/10/17 1449)  ondansetron (ZOFRAN) tablet 4 mg (4 mg Oral Given 03/10/17 1449)     Initial Impression / Assessment and Plan / ED Course  I have reviewed the triage vital signs and the nursing notes.  Pertinent labs & imaging results that were available during my care of the patient were reviewed by me and considered in my medical decision making (see chart for details).      Final Clinical Impressions(s) / ED Diagnoses MDM Vital signs reviewed. X-ray of the wrist shows a cortical irregularity along the ulnar aspect of the distal radial metaphysis with a transverse lucency present. The x-ray of the forearm is negative with the exception of the findings noted above.  Patient instructed on the exam findings as well as the x-ray findings. Questions were answered. Patient was fitted with a splint and sling. Prescription for Norco given to the patient to use for pain. The patient will follow with orthopedics this week.    Final diagnoses:  Closed fracture of distal end of right radius, unspecified fracture morphology, initial encounter    New Prescriptions New Prescriptions   No medications on file     Annette Stable 03/10/17 1536    Nat Christen, MD 03/11/17 [phone]

## 2017-03-10 NOTE — ED Triage Notes (Signed)
Pt fell on Monday after being tripped by a cat, right arm pain, bought a arm splint, Yesterday when hike, pt fell, arm pain is worse today, abrasion to right leg noted.

## 2017-03-10 NOTE — Discharge Instructions (Signed)
Please keep your wrist elevated above your heart. Apply ice. Use your splint sling until seen by Dr. Lillia Corporal or a member of his team. Use Tylenol for mild pain, use Norco for more severe pain.This medication may cause drowsiness. Please do not drink, drive, or participate in activity that requires concentration while taking this medication.
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
## 2018-08-25 ENCOUNTER — Encounter: Payer: Self-pay | Admitting: Gastroenterology

## 2018-09-30 ENCOUNTER — Encounter: Payer: Self-pay | Admitting: Gastroenterology

## 2018-12-10 IMAGING — DX DG FOREARM 2V*R*
2 series · 2 of 2 positions shown · non-contrast
Comparison: None.

CLINICAL DATA: Right forearm pain

EXAM:
RIGHT FOREARM - 2 VIEW

[forearm ap]
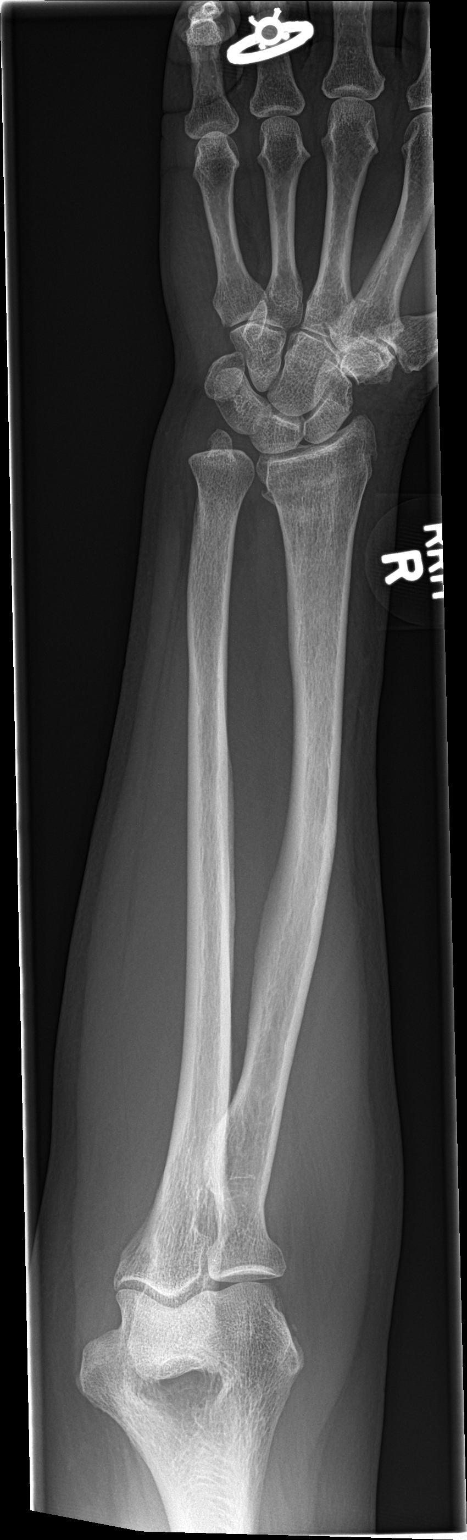

[forearm lat]
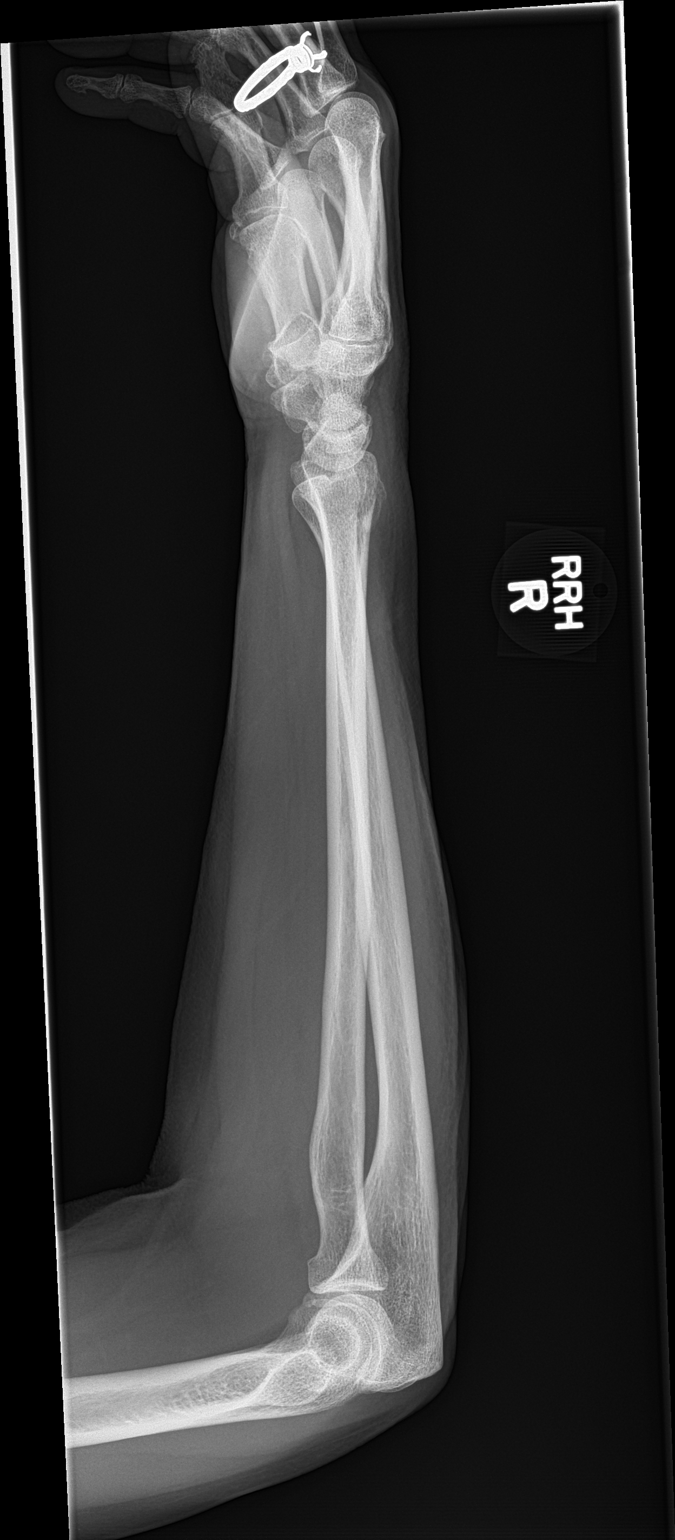

[2 of 2 positions shown; findings below may reference images not displayed]

FINDINGS: There is no evidence of fracture or other focal bone lesions. There
are mild hypertrophic changes around the distal radius. Soft tissues
are unremarkable.
IMPRESSION: 1.  No acute osseous injury of the right forearm.
2. If there is persistent clinical concern regarding right wrist
pain, recommend further evaluation with a dedicated wrist x-ray.

## 2021-02-10 ENCOUNTER — Encounter: Payer: Self-pay | Admitting: Gastroenterology
